# Patient Record
Sex: Female | Born: 1975
Health system: Southern US, Community
[De-identification: ages and names within clinical notes are randomized; demographics above are authoritative.]

## PROBLEM LIST (undated history)

## (undated) ENCOUNTER — Inpatient Hospital Stay (HOSPITAL_COMMUNITY): Payer: Self-pay

## (undated) DIAGNOSIS — N1370 Vesicoureteral-reflux, unspecified: Secondary | ICD-10-CM

## (undated) DIAGNOSIS — J40 Bronchitis, not specified as acute or chronic: Secondary | ICD-10-CM

## (undated) DIAGNOSIS — K219 Gastro-esophageal reflux disease without esophagitis: Secondary | ICD-10-CM

## (undated) DIAGNOSIS — IMO0002 Reserved for concepts with insufficient information to code with codable children: Secondary | ICD-10-CM

## (undated) DIAGNOSIS — I10 Essential (primary) hypertension: Secondary | ICD-10-CM

## (undated) DIAGNOSIS — O09529 Supervision of elderly multigravida, unspecified trimester: Secondary | ICD-10-CM

## (undated) DIAGNOSIS — Z98891 History of uterine scar from previous surgery: Secondary | ICD-10-CM

## (undated) DIAGNOSIS — Z3480 Encounter for supervision of other normal pregnancy, unspecified trimester: Secondary | ICD-10-CM

## (undated) DIAGNOSIS — N189 Chronic kidney disease, unspecified: Secondary | ICD-10-CM

## (undated) DIAGNOSIS — O09299 Supervision of pregnancy with other poor reproductive or obstetric history, unspecified trimester: Secondary | ICD-10-CM

## (undated) DIAGNOSIS — B019 Varicella without complication: Secondary | ICD-10-CM

## (undated) DIAGNOSIS — R87619 Unspecified abnormal cytological findings in specimens from cervix uteri: Secondary | ICD-10-CM

## (undated) DIAGNOSIS — T7840XA Allergy, unspecified, initial encounter: Secondary | ICD-10-CM

## (undated) DIAGNOSIS — Z87448 Personal history of other diseases of urinary system: Secondary | ICD-10-CM

## (undated) DIAGNOSIS — R12 Heartburn: Secondary | ICD-10-CM

## (undated) HISTORY — DX: Supervision of elderly multigravida, unspecified trimester: O09.529

## (undated) HISTORY — DX: Vesicoureteral-reflux, unspecified: N13.70

## (undated) HISTORY — DX: Unspecified abnormal cytological findings in specimens from cervix uteri: R87.619

## (undated) HISTORY — DX: Supervision of pregnancy with other poor reproductive or obstetric history, unspecified trimester: O09.299

## (undated) HISTORY — DX: Varicella without complication: B01.9

## (undated) HISTORY — DX: Allergy, unspecified, initial encounter: T78.40XA

## (undated) HISTORY — DX: Reserved for concepts with insufficient information to code with codable children: IMO0002

## (undated) HISTORY — DX: Gastro-esophageal reflux disease without esophagitis: K21.9

## (undated) HISTORY — DX: Personal history of other diseases of urinary system: Z87.448

## (undated) HISTORY — PX: KIDNEY SURGERY: SHX687

## (undated) HISTORY — PX: TONSILLECTOMY: SUR1361

---

## 2012-05-02 ENCOUNTER — Inpatient Hospital Stay (HOSPITAL_COMMUNITY)
Admission: AD | Admit: 2012-05-02 | Discharge: 2012-05-02 | Disposition: A | Discharge status: Home / Self Care | Payer: BC Managed Care – PPO | Source: Ambulatory Visit | Attending: Obstetrics & Gynecology | Admitting: Obstetrics & Gynecology

## 2012-05-02 ENCOUNTER — Inpatient Hospital Stay (HOSPITAL_COMMUNITY): Payer: BC Managed Care – PPO

## 2012-05-02 ENCOUNTER — Encounter (HOSPITAL_COMMUNITY): Payer: Self-pay | Admitting: *Deleted

## 2012-05-02 DIAGNOSIS — O209 Hemorrhage in early pregnancy, unspecified: Secondary | ICD-10-CM | POA: Insufficient documentation

## 2012-05-02 HISTORY — DX: Bronchitis, not specified as acute or chronic: J40

## 2012-05-02 HISTORY — DX: Chronic kidney disease, unspecified: N18.9

## 2012-05-02 HISTORY — DX: Essential (primary) hypertension: I10

## 2012-05-02 LAB — POCT PREGNANCY, URINE: Preg Test, Ur: POSITIVE — AB

## 2012-05-02 LAB — CBC WITH DIFFERENTIAL/PLATELET
Hemoglobin: 12.7 g/dL (ref 12.0–15.0)
Lymphocytes Relative: 33 % (ref 12–46)
Lymphs Abs: 2.9 10*3/uL (ref 0.7–4.0)
MCV: 89.2 fL (ref 78.0–100.0)
Neutrophils Relative %: 54 % (ref 43–77)
Platelets: 283 10*3/uL (ref 150–400)
RBC: 4.16 MIL/uL (ref 3.87–5.11)
WBC: 8.8 10*3/uL (ref 4.0–10.5)

## 2012-05-02 LAB — URINALYSIS, ROUTINE W REFLEX MICROSCOPIC
Nitrite: NEGATIVE
Protein, ur: NEGATIVE mg/dL
Urobilinogen, UA: 0.2 mg/dL (ref 0.0–1.0)

## 2012-05-02 LAB — HCG, QUANTITATIVE, PREGNANCY: hCG, Beta Chain, Quant, S: 2090 m[IU]/mL — ABNORMAL HIGH (ref <5)

## 2012-05-02 LAB — URINE MICROSCOPIC-ADD ON

## 2012-05-02 LAB — WET PREP, GENITAL
Clue Cells Wet Prep HPF POC: NONE SEEN
Trich, Wet Prep: NONE SEEN

## 2012-05-02 LAB — ABO/RH: ABO/RH(D): A POS

## 2012-05-02 NOTE — MAU Note (Signed)
+   HPT and at [facility]. Has been bleeding off and on. "Has never gushed."

## 2012-05-02 NOTE — MAU Provider Note (Signed)
History     CSN: [id number]  Arrival date and time: 05/02/12 1640   First Provider Initiated Contact with Patient 05/02/12 1904      Chief Complaint  Patient presents with  . Vaginal Bleeding   HPI Beth Glover is a 36 y.o. female @ [redacted]w[redacted]d gestation who presents to MAU with vaginal bleeding. The bleeding started 9/23 and thought was a period but only lasted 2 days. Then started again 4 days ago. She describes the bleeding as small amount. Associated symptoms include cramping that is mild. The history was provided by the patient.  OB History    Grav Para Term Preterm Abortions TAB SAB Ect Mult Living   4 2 2  1 1    2       Past Medical History  Diagnosis Date  . Hypertension   . Chronic kidney disease     R ureteral reflux  . Bronchitis     Past Surgical History  Procedure Date  . Tonsillectomy   . Kidney surgery     Correct R ureteral reflux, 2002    Family History  Problem Relation Age of Onset  . Cancer Mother   . Cancer Father   . Diabetes Sister     History  Substance Use Topics  . Smoking status: Current Every Day Smoker -- 1.0 packs/day  . Smokeless tobacco: Never Used  . Alcohol Use: Yes     every other night    Allergies:  Allergies  Allergen Reactions  . Amoxicillin Hives, Shortness Of Breath and Swelling    Can't take any cilllins  . Penicillins Hives, Shortness Of Breath and Swelling    Prescriptions prior to admission  Medication Sig Dispense Refill  . acetaminophen (TYLENOL) 500 MG tablet Take 1,000 mg by mouth every 6 (six) hours as needed. For pain or headache      . albuterol (PROVENTIL HFA;VENTOLIN HFA) 108 (90 BASE) MCG/ACT inhaler Inhale 2 puffs into the lungs every 6 (six) hours as needed. For bronchitis      . ibuprofen (ADVIL,MOTRIN) 200 MG tablet Take 400-1,000 mg by mouth every 6 (six) hours as needed. For pain or headache        Review of Systems  Constitutional: Negative for fever, chills and weight loss.  HENT:  Positive for congestion. Negative for ear pain, nosebleeds, sore throat and neck pain.   Eyes: Negative for blurred vision, double vision, photophobia and pain.  Respiratory: Positive for cough. Negative for shortness of breath and wheezing.   Cardiovascular: Negative for chest pain, palpitations and leg swelling.  Gastrointestinal: Positive for heartburn, nausea and abdominal pain (cramping). Negative for vomiting, diarrhea and constipation.  Genitourinary: Positive for frequency. Negative for dysuria and urgency.  Musculoskeletal: Positive for back pain (chronic). Negative for myalgias.  Skin: Negative for itching and rash.  Neurological: Positive for dizziness. Negative for sensory change, speech change, seizures, weakness and headaches.  Endo/Heme/Allergies: Does not bruise/bleed easily.  Psychiatric/Behavioral: Negative for depression. The patient is nervous/anxious. The patient does not have insomnia.    Physical Exam   Blood pressure 120/80, pulse 78, temperature 98.7 F (37.1 C), temperature source Oral, resp. rate 18, height 5' 2.5" (1.588 m), weight 110 lb (49.896 kg), last menstrual period 03/29/2012, SpO2 99.00%.  Physical Exam  Nursing note and vitals reviewed. Constitutional: She is oriented to person, place, and time. She appears well-developed and well-nourished. No distress.  HENT:  Head: Normocephalic and atraumatic.  Eyes: EOM are normal.  Neck: Neck supple.  Cardiovascular: Normal rate.   Respiratory: Effort normal.  GI: Soft. There is no tenderness.  Genitourinary:       External genitalia without lesions. Small blood vaginal vault. Cervix long, closed, no CMT, no adnexal tenderness or mass palpated. Uterus without palpable enlargement.  Musculoskeletal: Normal range of motion.  Neurological: She is alert and oriented to person, place, and time.  Skin: Skin is warm and dry.  Psychiatric: She has a normal mood and affect. Her behavior is normal. Judgment and  thought content normal.   Results for orders placed during the hospital encounter of 05/02/12 (from the past 24 hour(s))  URINALYSIS, ROUTINE W REFLEX MICROSCOPIC     Status: Abnormal   Collection Time   05/02/12  4:55 PM      Component Value Range   Color, Urine YELLOW  YELLOW   APPearance CLEAR  CLEAR   Specific Gravity, Urine 1.020  1.005 - 1.030   pH 7.0  5.0 - 8.0   Glucose, UA NEGATIVE  NEGATIVE mg/dL   Hgb urine dipstick LARGE (*) NEGATIVE   Bilirubin Urine NEGATIVE  NEGATIVE   Ketones, ur NEGATIVE  NEGATIVE mg/dL   Protein, ur NEGATIVE  NEGATIVE mg/dL   Urobilinogen, UA 0.2  0.0 - 1.0 mg/dL   Nitrite NEGATIVE  NEGATIVE   Leukocytes, UA NEGATIVE  NEGATIVE  URINE MICROSCOPIC-ADD ON     Status: Abnormal   Collection Time   05/02/12  4:55 PM      Component Value Range   Squamous Epithelial / LPF FEW (*) RARE   WBC, UA 3-6  <3 WBC/hpf   RBC / HPF 0-2  <3 RBC/hpf   Bacteria, UA MANY (*) RARE   Urine-Other MUCOUS PRESENT    POCT PREGNANCY, URINE     Status: Abnormal   Collection Time   05/02/12  5:25 PM      Component Value Range   Preg Test, Ur POSITIVE (*) NEGATIVE  CBC WITH DIFFERENTIAL     Status: Normal   Collection Time   05/02/12  5:57 PM      Component Value Range   WBC 8.8  4.0 - 10.5 K/uL   RBC 4.16  3.87 - 5.11 MIL/uL   Hemoglobin 12.7  12.0 - 15.0 g/dL   HCT 69.6  29.5 - 28.4 %   MCV 89.2  78.0 - 100.0 fL   MCH 30.5  26.0 - 34.0 pg   MCHC 34.2  30.0 - 36.0 g/dL   RDW 13.2  44.0 - 10.2 %   Platelets 283  150 - 400 K/uL   Neutrophils Relative 54  43 - 77 %   Neutro Abs 4.8  1.7 - 7.7 K/uL   Lymphocytes Relative 33  12 - 46 %   Lymphs Abs 2.9  0.7 - 4.0 K/uL   Monocytes Relative 7  3 - 12 %   Monocytes Absolute 0.6  0.1 - 1.0 K/uL   Eosinophils Relative 5  0 - 5 %   Eosinophils Absolute 0.5  0.0 - 0.7 K/uL   Basophils Relative 1  0 - 1 %   Basophils Absolute 0.0  0.0 - 0.1 K/uL  HCG, QUANTITATIVE, PREGNANCY     Status: Abnormal   Collection Time    05/02/12  5:57 PM      Component Value Range   hCG, Beta Chain, Quant, S 2090 (*) <5 mIU/mL  ABO/RH     Status: Normal   Collection Time   05/02/12  5:57  PM      Component Value Range   ABO/RH(D) A POS    WET PREP, GENITAL     Status: Abnormal   Collection Time   05/02/12  7:06 PM      Component Value Range   Yeast Wet Prep HPF POC NONE SEEN  NONE SEEN   Trich, Wet Prep NONE SEEN  NONE SEEN   Clue Cells Wet Prep HPF POC NONE SEEN  NONE SEEN   WBC, Wet Prep HPF POC MODERATE (*) NONE SEEN   US Ob Comp Less 14 Wks  05/02/2012  *RADIOLOGY REPORT*  Clinical Data: Positive pregnancy test.  OBSTETRIC <14 WK [place] AND TRANSVAGINAL OB US  Technique:  Both transabdominal and transvaginal ultrasound examinations were performed for complete evaluation of the gestation as well as the maternal uterus, adnexal regions, and pelvic cul-de-sac.  Transvaginal technique was performed to assess early pregnancy.  Comparison:  None.  Intrauterine gestational sac:  Tiny sac like structure is identified in the endometrial cavity. Yolk sac: Not visualized Embryo: Not visualized Cardiac Activity: N/A  MSD: 3.2 mm  4 w 6 d  Maternal uterus/adnexae: No evidence for subchorionic hemorrhage.  Maternal right ovary is sonographically normal in appearance.  The maternal left ovary is also normal.  Hypoechoic structure in the left ovarian parenchyma is compatible with a hemorrhagic corpus luteum cyst.  No free fluid in the cul-de-sac.  IMPRESSION:  Probable early intrauterine gestational sac, but no yolk sac, fetal pole, or cardiac activity yet visualized.  Recommend follow- up quantitative B-HCG levels and follow-up US in 14 days to confirm and assess viability.   .   Original Report Authenticated By: ERIC A. MANSELL, M.D.    US Ob Transvaginal  05/02/2012  *RADIOLOGY REPORT*  Clinical Data: Positive pregnancy test.  OBSTETRIC <14 WK [place] AND TRANSVAGINAL OB US  Technique:  Both transabdominal and transvaginal ultrasound examinations  were performed for complete evaluation of the gestation as well as the maternal uterus, adnexal regions, and pelvic cul-de-sac.  Transvaginal technique was performed to assess early pregnancy.  Comparison:  None.  Intrauterine gestational sac:  Tiny sac like structure is identified in the endometrial cavity. Yolk sac: Not visualized Embryo: Not visualized Cardiac Activity: N/A  MSD: 3.2 mm  4 w 6 d  Maternal uterus/adnexae: No evidence for subchorionic hemorrhage.  Maternal right ovary is sonographically normal in appearance.  The maternal left ovary is also normal.  Hypoechoic structure in the left ovarian parenchyma is compatible with a hemorrhagic corpus luteum cyst.  No free fluid in the cul-de-sac.  IMPRESSION:  Probable early intrauterine gestational sac, but no yolk sac, fetal pole, or cardiac activity yet visualized.  Recommend follow- up quantitative B-HCG levels and follow-up US in 14 days to confirm and assess viability.   .   Original Report Authenticated By: ERIC A. MANSELL, M.D.    Assessment: 36 y.o. female with bleeding in early pregnancy  Plan:  Return in 48 hours for bhcg   Return sooner for problems.   Pelvic rest  I have reviewed this patient's vital signs, nurses notes, appropriate labs and imaging. I have discussed the results with the patient and need for follow up. Patient voices understanding.   Medication List     As of 05/02/2012  8:01 PM    CONTINUE taking these medications         acetaminophen 500 MG tablet   Commonly known as: TYLENOL      albuterol 108 (90 BASE)  MCG/ACT inhaler   Commonly known as: PROVENTIL HFA;VENTOLIN HFA      STOP taking these medications         ibuprofen 200 MG tablet   Commonly known as: ADVIL,MOTRIN         MAU Course  Procedures Tiffanee Mcnee, RN, FNP, BC 05/02/2012, 7:41 PM

## 2012-05-06 ENCOUNTER — Inpatient Hospital Stay (HOSPITAL_COMMUNITY)
Admission: AD | Admit: 2012-05-06 | Discharge: 2012-05-06 | Disposition: A | Discharge status: Home / Self Care | Payer: BC Managed Care – PPO | Source: Ambulatory Visit | Attending: Obstetrics & Gynecology | Admitting: Obstetrics & Gynecology

## 2012-05-06 DIAGNOSIS — O209 Hemorrhage in early pregnancy, unspecified: Secondary | ICD-10-CM | POA: Insufficient documentation

## 2012-05-06 DIAGNOSIS — R109 Unspecified abdominal pain: Secondary | ICD-10-CM

## 2012-05-06 DIAGNOSIS — O26899 Other specified pregnancy related conditions, unspecified trimester: Secondary | ICD-10-CM

## 2012-05-06 NOTE — MAU Note (Signed)
Pt reports she had some bright red bleeding yesterday and clumps come out. No bleeding today and mild cramping light discharge

## 2012-05-06 NOTE — MAU Provider Note (Signed)
  History     CSN: [id number]  Arrival date and time: 05/06/12 0919   None     No chief complaint on file.  HPI 36 y.o. [id number] here for f/u quant. HCG was 2000 4 days ago, had pain and some bleeding. Yesterday passed some small clots, bleeding decreased today.    Past Medical History  Diagnosis Date  . Hypertension   . Chronic kidney disease     R ureteral reflux  . Bronchitis     Past Surgical History  Procedure Date  . Tonsillectomy   . Kidney surgery     Correct R ureteral reflux, 2002    Family History  Problem Relation Age of Onset  . Cancer Mother   . Cancer Father   . Diabetes Sister     History  Substance Use Topics  . Smoking status: Current Every Day Smoker -- 1.0 packs/day  . Smokeless tobacco: Never Used  . Alcohol Use: Yes     every other night    Allergies:  Allergies  Allergen Reactions  . Amoxicillin Hives, Shortness Of Breath and Swelling    Can't take any cilllins  . Penicillins Hives, Shortness Of Breath and Swelling    Prescriptions prior to admission  Medication Sig Dispense Refill  . acetaminophen (TYLENOL) 500 MG tablet Take 1,000 mg by mouth every 6 (six) hours as needed. For pain or headache      . albuterol (PROVENTIL HFA;VENTOLIN HFA) 108 (90 BASE) MCG/ACT inhaler Inhale 2 puffs into the lungs every 6 (six) hours as needed. For bronchitis        Review of Systems  Constitutional: Negative.   Respiratory: Negative.   Cardiovascular: Negative.   Gastrointestinal: Negative.    Physical Exam   Blood pressure 130/82, pulse 75, temperature 98.1 F (36.7 C), temperature source Oral, resp. rate 18, last menstrual period 03/29/2012.  Physical Exam  Nursing note and vitals reviewed.   MAU Course  Procedures Results for orders placed during the hospital encounter of 05/06/12 (from the past 24 hour(s))  HCG, QUANTITATIVE, PREGNANCY     Status: Abnormal   Collection Time   05/06/12  9:41 AM      Component Value Range   hCG,  Beta Chain, Quant, S 8537 (*) <5 mIU/mL     Assessment and Plan  36 y.o. [id number] at [redacted]w[redacted]d with bleeding Follow-up Information    Follow up with [facility] [place] [facility]ERN[facility] ADMISS[facility]. On 05/14/2012.   Contact information:   [address] [id number] mc [place] [address] [phone]      Follow up with [facility] [place] [facility]TRASOUND. On 05/14/2012.   Contact information:   [address] [id number] mc [place] [address] [phone]        Precautions rev'd  Georges Mouse 05/06/2012, 10:44 AM

## 2012-05-06 NOTE — ED Notes (Signed)
N.Fraizier,CNM called pt with results of BHCG. U/s scheduled for 10/14

## 2012-05-14 ENCOUNTER — Ambulatory Visit (HOSPITAL_COMMUNITY)
Admission: RE | Admit: 2012-05-14 | Discharge: 2012-05-14 | Disposition: A | Discharge status: Home / Self Care | Payer: BC Managed Care – PPO | Source: Ambulatory Visit | Attending: Advanced Practice Midwife | Admitting: Advanced Practice Midwife

## 2012-05-14 ENCOUNTER — Ambulatory Visit (HOSPITAL_COMMUNITY): Payer: BC Managed Care – PPO

## 2012-05-14 DIAGNOSIS — Z3689 Encounter for other specified antenatal screening: Secondary | ICD-10-CM | POA: Insufficient documentation

## 2012-05-14 DIAGNOSIS — O3680X0 Pregnancy with inconclusive fetal viability, not applicable or unspecified: Secondary | ICD-10-CM | POA: Insufficient documentation

## 2012-05-14 DIAGNOSIS — O26899 Other specified pregnancy related conditions, unspecified trimester: Secondary | ICD-10-CM

## 2012-06-27 LAB — OB RESULTS CONSOLE HIV ANTIBODY (ROUTINE TESTING): HIV: NONREACTIVE

## 2012-06-27 LAB — OB RESULTS CONSOLE ANTIBODY SCREEN: Antibody Screen: NEGATIVE

## 2012-06-27 LAB — OB RESULTS CONSOLE RUBELLA ANTIBODY, IGM: Rubella: IMMUNE

## 2012-06-27 LAB — OB RESULTS CONSOLE RPR: RPR: NONREACTIVE

## 2012-06-27 LAB — OB RESULTS CONSOLE HEPATITIS B SURFACE ANTIGEN: Hepatitis B Surface Ag: NEGATIVE

## 2012-09-14 ENCOUNTER — Encounter (HOSPITAL_COMMUNITY): Payer: Self-pay | Admitting: *Deleted

## 2012-09-14 ENCOUNTER — Inpatient Hospital Stay (HOSPITAL_COMMUNITY)
Admission: AD | Admit: 2012-09-14 | Discharge: 2012-09-14 | Disposition: A | Discharge status: Home / Self Care | Payer: BC Managed Care – PPO | Source: Ambulatory Visit | Attending: Obstetrics and Gynecology | Admitting: Obstetrics and Gynecology

## 2012-09-14 DIAGNOSIS — O479 False labor, unspecified: Secondary | ICD-10-CM

## 2012-09-14 DIAGNOSIS — O4700 False labor before 37 completed weeks of gestation, unspecified trimester: Secondary | ICD-10-CM | POA: Insufficient documentation

## 2012-09-14 DIAGNOSIS — O4702 False labor before 37 completed weeks of gestation, second trimester: Secondary | ICD-10-CM

## 2012-09-14 HISTORY — DX: Heartburn: R12

## 2012-09-14 LAB — URINALYSIS, ROUTINE W REFLEX MICROSCOPIC
Leukocytes, UA: NEGATIVE
Nitrite: NEGATIVE
Protein, ur: NEGATIVE mg/dL
Urobilinogen, UA: 0.2 mg/dL (ref 0.0–1.0)

## 2012-09-14 LAB — WET PREP, GENITAL
Clue Cells Wet Prep HPF POC: NONE SEEN
Trich, Wet Prep: NONE SEEN
Yeast Wet Prep HPF POC: NONE SEEN

## 2012-09-14 MED ORDER — NIFEDIPINE 10 MG PO CAPS
20.0000 mg | ORAL_CAPSULE | Freq: Three times a day (TID) | ORAL | Status: DC
  Administered 2012-09-14: 20 mg via ORAL
  Filled 2012-09-14: qty 2

## 2012-09-14 MED ORDER — ACETAMINOPHEN 500 MG PO TABS
1000.0000 mg | ORAL_TABLET | Freq: Once | ORAL | Status: AC
  Administered 2012-09-14: 1000 mg via ORAL
  Filled 2012-09-14: qty 2

## 2012-09-14 MED ORDER — LACTATED RINGERS IV BOLUS (SEPSIS)
1000.0000 mL | Freq: Once | INTRAVENOUS | Status: AC
  Administered 2012-09-14: 1000 mL via INTRAVENOUS

## 2012-09-14 MED ORDER — TERBUTALINE SULFATE 1 MG/ML IJ SOLN
0.2500 mg | Freq: Once | INTRAMUSCULAR | Status: AC
  Administered 2012-09-14: 0.25 mg via SUBCUTANEOUS
  Filled 2012-09-14: qty 1

## 2012-09-14 NOTE — MAU Provider Note (Signed)
History     CSN: [id number]  Arrival date and time: 09/14/12 4098   First Provider Initiated Contact with Patient 09/14/12 1027      Chief Complaint  Patient presents with  . Contractions   HPI 37 y.o. [id number] at [redacted]w[redacted]d with contractions since last night, q 5 min, feel somewhat stronger now, no bleeding or LOF. Intercourse last night. Uncomplicated pregnancy, no h/o preterm delivery.   Past Medical History  Diagnosis Date  . Hypertension   . Chronic kidney disease     R ureteral reflux  . Bronchitis   . Heartburn     Past Surgical History  Procedure Laterality Date  . Tonsillectomy    . Kidney surgery      Correct R ureteral reflux, 2002    Family History  Problem Relation Age of Onset  . Cancer Mother   . Cancer Father   . Diabetes Sister     History  Substance Use Topics  . Smoking status: Current Every Day Smoker -- 0.25 packs/day    Types: Cigarettes  . Smokeless tobacco: Never Used  . Alcohol Use: Yes     Comment: Occas. since + UPT    Allergies:  Allergies  Allergen Reactions  . Amoxicillin Hives, Shortness Of Breath and Swelling    Can't take any cilllins  . Penicillins Hives, Shortness Of Breath and Swelling  . Lisinopril Other (See Comments)    Throat itching  . Amlodipine Palpitations    Prescriptions prior to admission  Medication Sig Dispense Refill  . acetaminophen (TYLENOL) 500 MG tablet Take 1,000 mg by mouth every 6 (six) hours as needed. For pain or headache      . ranitidine (ZANTAC) 150 MG tablet Take 150 mg by mouth daily as needed for heartburn.      . Sodium Bicarbonate-Citric Acid (ALKA-SELTZER HEARTBURN PO) Take 1-2 tablets by mouth daily as needed. For heartburn        Review of Systems  Constitutional: Negative.   Respiratory: Negative.   Cardiovascular: Negative.   Gastrointestinal: Negative for nausea, vomiting, abdominal pain, diarrhea and constipation.  Genitourinary: Negative for dysuria, urgency, frequency,  hematuria and flank pain.       Negative for vaginal bleeding, + cramping/contractions  Musculoskeletal: Negative.   Neurological: Negative.   Psychiatric/Behavioral: Negative.    Physical Exam   Blood pressure 134/81, pulse 94, temperature 98.6 F (37 C), temperature source Oral, resp. rate 18, height 5' 2.5" (1.588 m), weight 131 lb (59.421 kg), last menstrual period 03/29/2012.  Physical Exam  Nursing note and vitals reviewed. Constitutional: She is oriented to person, place, and time. She appears well-developed and well-nourished. No distress.  Cardiovascular: Normal rate.   Respiratory: Effort normal.  GI: Soft. There is no tenderness.  Genitourinary: No bleeding around the vagina. No vaginal discharge found.  Dilation: Fingertip ext os/int os closed Effacement (%): Thick Cervical Position: Posterior Station: -3 Exam by:: Telford Nab, CNM   Musculoskeletal: Normal range of motion.  Neurological: She is alert and oriented to person, place, and time.  Skin: Skin is warm and dry.  Psychiatric: She has a normal mood and affect.   EFM reassuring at 24 weeks TOCO: uc q 2-4 min  MAU Course  Procedures Results for orders placed during the hospital encounter of 09/14/12 (from the past 24 hour(s))  URINALYSIS, ROUTINE W REFLEX MICROSCOPIC     Status: Abnormal   Collection Time    09/14/12  9:25 AM  Result Value Range   Color, Urine YELLOW  YELLOW   APPearance CLEAR  CLEAR   Specific Gravity, Urine 1.025  1.005 - 1.030   pH 6.0  5.0 - 8.0   Glucose, UA NEGATIVE  NEGATIVE mg/dL   Hgb urine dipstick NEGATIVE  NEGATIVE   Bilirubin Urine NEGATIVE  NEGATIVE   Ketones, ur 15 (*) NEGATIVE mg/dL   Protein, ur NEGATIVE  NEGATIVE mg/dL   Urobilinogen, UA 0.2  0.0 - 1.0 mg/dL   Nitrite NEGATIVE  NEGATIVE   Leukocytes, UA NEGATIVE  NEGATIVE  WET PREP, GENITAL     Status: Abnormal   Collection Time    09/14/12 10:26 AM      Result Value Range   Yeast Wet Prep HPF POC NONE SEEN   NONE SEEN   Trich, Wet Prep NONE SEEN  NONE SEEN   Clue Cells Wet Prep HPF POC NONE SEEN  NONE SEEN   WBC, Wet Prep HPF POC FEW (*) NONE SEEN   IV hydration for tocolysis, no improvement in contractions Procardia 20 mg po given - no imprevement Terbutaline 0.25 mg [place] - contractions gradually decreased   Assessment and Plan   1. Preterm uterine contractions, second trimester      Medication List    TAKE these medications       acetaminophen 500 MG tablet  Commonly known as:  TYLENOL  Take 1,000 mg by mouth every 6 (six) hours as needed. For pain or headache     ALKA-SELTZER HEARTBURN PO  Take 1-2 tablets by mouth daily as needed. For heartburn     ranitidine 150 MG tablet  Commonly known as:  ZANTAC  Take 150 mg by mouth daily as needed for heartburn.         Follow-up Information   Follow up with BOVARD,JODY, MD. (as scheduled or sooner as needed)    Contact information:   [address] [place] [address] [phone]         Haizlee Henton 09/14/2012, 10:52 AM

## 2012-09-14 NOTE — MAU Note (Signed)
Pt states she started having pressure & uc's @ 0100, continues this a.m.  Denis bleeding or LOF.

## 2012-09-14 NOTE — MAU Note (Signed)
States had intercourse last night.

## 2012-09-15 LAB — GC/CHLAMYDIA PROBE AMP
CT Probe RNA: NEGATIVE
GC Probe RNA: NEGATIVE

## 2012-10-12 ENCOUNTER — Encounter (HOSPITAL_COMMUNITY): Payer: Self-pay | Admitting: *Deleted

## 2012-10-12 ENCOUNTER — Inpatient Hospital Stay (HOSPITAL_COMMUNITY)
Admission: AD | Admit: 2012-10-12 | Discharge: 2012-10-12 | Disposition: A | Discharge status: Home / Self Care | Payer: BC Managed Care – PPO | Source: Ambulatory Visit | Attending: Obstetrics and Gynecology | Admitting: Obstetrics and Gynecology

## 2012-10-12 DIAGNOSIS — O4703 False labor before 37 completed weeks of gestation, third trimester: Secondary | ICD-10-CM

## 2012-10-12 DIAGNOSIS — O4690 Antepartum hemorrhage, unspecified, unspecified trimester: Secondary | ICD-10-CM | POA: Insufficient documentation

## 2012-10-12 DIAGNOSIS — O479 False labor, unspecified: Secondary | ICD-10-CM

## 2012-10-12 DIAGNOSIS — O4700 False labor before 37 completed weeks of gestation, unspecified trimester: Secondary | ICD-10-CM | POA: Insufficient documentation

## 2012-10-12 MED ORDER — NIFEDIPINE 10 MG PO CAPS
10.0000 mg | ORAL_CAPSULE | Freq: Once | ORAL | Status: AC
  Administered 2012-10-12: 10 mg via ORAL
  Filled 2012-10-12: qty 1

## 2012-10-12 NOTE — MAU Provider Note (Signed)
History     CSN: [id number]  Arrival date and time: 10/12/12 0025   None     No chief complaint on file.  HPI Pt is Beth Glover at [redacted]w[redacted]d.  She has been on pelvic rest for preterm contractions.  She was just checked on Tuesday by Dr. Jackelyn Knife and cervix was closed; pt's restriction of pelvic reset was removed.  Pt was having sex about 1 hour ago and started bleeding and having contractions after.  Pt's bleeding has subsided since she arrived in MAU but ctx continue.  Pt has had pressure on her bladder with frequent urination.  Past Medical History  Diagnosis Date  . Hypertension   . Chronic kidney disease     R ureteral reflux  . Bronchitis   . Heartburn     Past Surgical History  Procedure Laterality Date  . Tonsillectomy    . Kidney surgery      Correct R ureteral reflux, 2002    Family History  Problem Relation Age of Onset  . Cancer Mother   . Cancer Father   . Diabetes Sister     History  Substance Use Topics  . Smoking status: Current Every Day Smoker -- 0.25 packs/day    Types: Cigarettes  . Smokeless tobacco: Never Used  . Alcohol Use: Yes     Comment: Occas. since + UPT,  LAST TIME HAD MARIJUANA- SEPT.Marland Kitchen  LAST TIME HAD ALCOHOL- [place] EVE    Allergies:  Allergies  Allergen Reactions  . Amoxicillin Hives, Shortness Of Breath and Swelling    Can't take any cilllins  . Penicillins Hives, Shortness Of Breath and Swelling  . Lisinopril Other (See Comments)    Throat itching  . Amlodipine Palpitations    Prescriptions prior to admission  Medication Sig Dispense Refill  . acetaminophen (TYLENOL) 500 MG tablet Take 1,000 mg by mouth every 6 (six) hours as needed. For pain or headache      . ranitidine (ZANTAC) 150 MG tablet Take 150 mg by mouth daily as needed for heartburn.      . Sodium Bicarbonate-Citric Acid (ALKA-SELTZER HEARTBURN PO) Take 1-2 tablets by mouth daily as needed. For heartburn        Review of Systems  Constitutional: Negative for fever and  chills.  HENT: Negative for hearing loss.   Gastrointestinal: Positive for abdominal pain. Negative for heartburn, nausea and vomiting.  Genitourinary: Positive for frequency. Negative for dysuria and urgency.   Physical Exam   Blood pressure 143/90, pulse 99, temperature 98.4 F (36.9 C), temperature source Oral, resp. rate 20, height 5\' 2"  (1.575 m), weight 147 lb (66.679 kg), last menstrual period 03/29/2012.  Physical Exam  Nursing note and vitals reviewed. Constitutional: She is oriented to person, place, and time. She appears well-developed and well-nourished. No distress.  HENT:  Head: Normocephalic.  Eyes: Pupils are equal, round, and reactive to light.  Neck: Normal range of motion. Neck supple.  Cardiovascular: Normal rate.   Respiratory: Effort normal.  GI: Soft.  Ctx every 2 to 4 min; FHT reassuring for gestational age  Genitourinary:  Small amount of pink watery discharge in vault; cervix closed, NT; uterus gravid NT  Musculoskeletal: Normal range of motion.  Neurological: She is alert and oriented to person, place, and time.  Skin: Skin is warm and dry.  Psychiatric: She has a normal mood and affect.    MAU Course  Procedures Pt having ctx every 2 to 3 minutes-cervix closed- discussed with Dr, Jackelyn Knife- Procardia  10mg  PO given- pt continued to have ctx 25 minutes after initial dose of Procardia- reordered 10mg  PO Pt's ctx spaced out  To 5 min apart and diminished in intensity to mild-level 1/10; pt states the ctx are more like she had when she was discharged home previously;  pt did not want terbutaline - discussed with Dr. Jackelyn Knife Pt advised to call office in morning to discuss with her primary OB (Dr. Ellyn Hack) when she would like to see her next  Assessment and Plan  Threatened preterm labor Call office tomorrow for advice on follow up  Beth Glover 10/12/2012, 1:00 AM

## 2012-10-12 NOTE — MAU Note (Signed)
PT SAYS SHE STARTED HAVING HEAVY BLEEDING-  DURING SEX- SHE THINKS ITS A CUT  FROM A FINGERNAIL.   IN RM  4 - NO BLEEDING.    WAS HERE  FEB  TO STOP UC- TOLD NO SEX. WAS AT DR ON 3-5- WAS TOLD LIFTED PELVIC REST.   DENIES HSV AND MMRSA.

## 2012-10-15 NOTE — Progress Notes (Signed)
FHT from 4-14 reviewed.  Reactive NST, irreg ctx that gradually spaced out and did not change her cervix.

## 2012-12-23 ENCOUNTER — Inpatient Hospital Stay (HOSPITAL_COMMUNITY)
Admission: AD | Admit: 2012-12-23 | Discharge: 2012-12-23 | Disposition: A | Discharge status: Home / Self Care | Payer: BC Managed Care – PPO | Source: Ambulatory Visit | Attending: Obstetrics and Gynecology | Admitting: Obstetrics and Gynecology

## 2012-12-23 DIAGNOSIS — O479 False labor, unspecified: Secondary | ICD-10-CM | POA: Insufficient documentation

## 2012-12-23 DIAGNOSIS — O99891 Other specified diseases and conditions complicating pregnancy: Secondary | ICD-10-CM

## 2012-12-23 DIAGNOSIS — M549 Dorsalgia, unspecified: Secondary | ICD-10-CM | POA: Insufficient documentation

## 2012-12-23 LAB — POCT FERN TEST: POCT Fern Test: NEGATIVE

## 2012-12-23 NOTE — MAU Note (Signed)
Was sitting on couch and felt leaking happened around 9:00 p.m. No leaking at this time, pelvic pressure, no contractions.

## 2012-12-23 NOTE — MAU Provider Note (Signed)
  History     CSN: [id number]  Arrival date and time: 12/23/12 2201   First Provider Initiated Contact with Patient 12/23/12 2232      Chief Complaint  Patient presents with  . Vaginal Discharge   HPI This is a 37 y.o. female at [redacted]w[redacted]d who presents for report of leaking fluid at home. Some back pain Asked to do a rule out rupture check  OB History   Grav Para Term Preterm Abortions TAB SAB Ect Mult Living   4 2 2  1 1    2       Past Medical History  Diagnosis Date  . Hypertension   . Chronic kidney disease     R ureteral reflux  . Bronchitis   . Heartburn     Past Surgical History  Procedure Laterality Date  . Tonsillectomy    . Kidney surgery      Correct R ureteral reflux, 2002    Family History  Problem Relation Age of Onset  . Cancer Mother   . Cancer Father   . Diabetes Sister     History  Substance Use Topics  . Smoking status: Current Every Day Smoker -- 0.25 packs/day    Types: Cigarettes  . Smokeless tobacco: Never Used  . Alcohol Use: Yes     Comment: Occas. since + UPT,  LAST TIME HAD MARIJUANA- SEPT.Marland Kitchen  LAST TIME HAD ALCOHOL- [place] EVE    Allergies:  Allergies  Allergen Reactions  . Amoxicillin Hives, Shortness Of Breath and Swelling    Can't take any cilllins  . Penicillins Hives, Shortness Of Breath and Swelling  . Lisinopril Other (See Comments)    Throat itching  . Amlodipine Palpitations    Prescriptions prior to admission  Medication Sig Dispense Refill  . acetaminophen (TYLENOL) 500 MG tablet Take 1,000 mg by mouth every 6 (six) hours as needed. For pain or headache      . Prenatal Vit-Fe Fumarate-FA (PRENATAL MULTIVITAMIN) TABS Take 1 tablet by mouth daily at 12 noon.      . ranitidine (ZANTAC) 150 MG tablet Take 150 mg by mouth daily as needed for heartburn.        Review of Systems  Constitutional: Negative for fever.  Gastrointestinal: Negative for abdominal pain.  Genitourinary: Negative for dysuria.  Musculoskeletal:  Positive for back pain.   Physical Exam   Blood pressure 143/90, pulse 92, temperature 98.2 F (36.8 C), temperature source Oral, resp. rate 18, height 5' 2.5" (1.588 m), weight 72.757 kg (160 lb 6.4 oz), last menstrual period 03/29/2012.  Physical Exam  Constitutional: She is oriented to person, place, and time. She appears well-developed and well-nourished.  Cardiovascular: Normal rate.   Respiratory: Effort normal.  GI: Soft. There is no tenderness.  Genitourinary: Uterus normal. Vaginal discharge (milky white, no pooling, no ferning) found.  Musculoskeletal: Normal range of motion.  Neurological: She is alert and oriented to person, place, and time.  Skin: Skin is warm and dry.  Psychiatric: She has a normal mood and affect.    MAU Course  Procedures  MDM FHR reactive UCs irregular and  Mild Dilation: 1 Effacement (%): Thick Station: -3 Exam by:: Wynelle Bourgeois CNM   Assessment and Plan  A:  SIUP at [redacted]w[redacted]d       No evidence of ROM  P:  RN will speak with MD  [facility] 12/23/2012, 10:50 PM

## 2012-12-25 ENCOUNTER — Encounter (HOSPITAL_COMMUNITY): Payer: Self-pay | Admitting: *Deleted

## 2012-12-25 ENCOUNTER — Telehealth (HOSPITAL_COMMUNITY): Payer: Self-pay | Admitting: *Deleted

## 2012-12-25 NOTE — Telephone Encounter (Signed)
Preadmission screen  

## 2012-12-31 ENCOUNTER — Encounter (HOSPITAL_COMMUNITY): Payer: Self-pay

## 2012-12-31 DIAGNOSIS — Z3480 Encounter for supervision of other normal pregnancy, unspecified trimester: Secondary | ICD-10-CM

## 2012-12-31 HISTORY — DX: Encounter for supervision of other normal pregnancy, unspecified trimester: Z34.80

## 2012-12-31 MED ORDER — FAMOTIDINE 20 MG PO TABS
20.0000 mg | ORAL_TABLET | Freq: Every day | ORAL | Status: DC
  Administered 2013-01-01: 20 mg via ORAL
  Filled 2012-12-31: qty 1

## 2012-12-31 NOTE — H&P (Signed)
Beth Glover is a 37 y.o. female [id number] at 39+ for iol given term pregnancy and favorable cervix.  Relatively uncomplicated pregnancy - has h/o PIH and pyelo, Also CHTN?, AMA, weaned off adderall, EIF noted in anatomy scan.  D/W pt r/b/a of IOL.. Maternal Medical History:  Contractions: Frequency: irregular.   Perceived severity is moderate.    Fetal activity: Perceived fetal activity is normal.    Prenatal complications: no prenatal complications Prenatal Complications - Diabetes: none.    OB History   Grav Para Term Preterm Abortions TAB SAB Ect Mult Living   4 2 2  1 1    2     G1 TSVD PIH, pyelo; G2 TSVD PIH, pyelo; G3 TAB; G4 present H/O abn pap, nl since No STDs  Past Medical History  Diagnosis Date  . Hypertension   . Chronic kidney disease     R ureteral reflux  . Bronchitis   . Heartburn   . Abnormal Pap smear   . Ureteral reflux     right  . GERD (gastroesophageal reflux disease)   . Hx of pyelonephritis   . H/O pre-eclampsia in prior pregnancy, currently pregnant   . AMA (advanced maternal age) multigravida 35+   . Normal pregnancy, repeat 12/31/2012   Past Surgical History  Procedure Laterality Date  . Tonsillectomy    . Kidney surgery      Correct R ureteral reflux, 2002   Family History: family history includes Cancer in her father and mother and Diabetes in her sister. Social History:  reports that she has been smoking Cigarettes.  She has been smoking about 0.25 packs per day. She has never used smokeless tobacco. She reports that  drinks alcohol. She reports that she uses illicit drugs (Marijuana). Meds: PNV, Zantac All PCN, Amoxicillin,Lisinopril, Amlodipine    Prenatal Transfer Tool  Maternal Diabetes: No Genetic Screening: Normal Maternal Ultrasounds/Referrals: Abnormal:  Findings:   Isolated EIF (echogenic intracardiac focus) Fetal Ultrasounds or other Referrals:  None Maternal Substance Abuse:  Yes:  Type: Smoker, Marijuana Significant  Maternal Medications:  None Significant Maternal Lab Results:  Lab values include: Group B Strep negative Other Comments:  weaned Adderall  Review of Systems  Constitutional: Negative.   HENT: Negative.   Eyes: Negative.   Respiratory: Negative.   Cardiovascular: Negative.   Gastrointestinal: Negative.   Genitourinary: Negative.   Musculoskeletal: Negative.   Skin: Negative.   Neurological: Negative.   Psychiatric/Behavioral: Negative.       Last menstrual period 03/29/2012. Maternal Exam:  Uterine Assessment: Contraction frequency is irregular.   Abdomen: Fundal height is appropriate for gestation.   Estimated fetal weight is 7-8#.   Fetal presentation: vertex  Introitus: Normal vulva. Normal vagina.  Pelvis: adequate for delivery.   Cervix: Cervix evaluated by digital exam.     Physical Exam  Constitutional: She is oriented to person, place, and time. She appears well-developed and well-nourished.  HENT:  Head: Normocephalic and atraumatic.  Cardiovascular: Normal rate and regular rhythm.   Respiratory: Effort normal and breath sounds normal. No respiratory distress.  GI: Soft. Bowel sounds are normal. There is no tenderness.  Musculoskeletal: Normal range of motion.  Neurological: She is alert and oriented to person, place, and time.  Skin: Skin is warm and dry.  Psychiatric: She has a normal mood and affect. Her behavior is normal.    Prenatal labs: ABO, Rh: --/--/A POS (10/02 1757) Antibody:  neg Rubella:  immune RPR:   NR HBsAg:   neg  HIV:   neg GBS:   neg  Hgb 13.9/Pap HR HPV neg/Plt 239K/ GC neg/ Chl neg/First Tri Scr WNL/ AFP WNL/glucola 135  [place] EDC 6/7 [place] nl anat, EIF, ant plac, female  Assessment/Plan: 36yo [id number] at 39+ for IOL  Expect SVD gbbs neg, no prophylaxis Pitocin and AROM for IOL   Beth Glover,Beth Glover 12/31/2012, 8:24 PM

## 2013-01-01 ENCOUNTER — Inpatient Hospital Stay (HOSPITAL_COMMUNITY)
Admission: RE | Admit: 2013-01-01 | Discharge: 2013-01-04 | DRG: 651 | Disposition: A | Discharge status: Home / Self Care | Payer: BC Managed Care – PPO | Source: Ambulatory Visit | Attending: Obstetrics and Gynecology | Admitting: Obstetrics and Gynecology

## 2013-01-01 ENCOUNTER — Encounter (HOSPITAL_COMMUNITY): Payer: Self-pay

## 2013-01-01 VITALS — BP 106/64 | HR 76 | Temp 97.8°F | Resp 18 | Ht 62.5 in | Wt 160.0 lb

## 2013-01-01 DIAGNOSIS — Z302 Encounter for sterilization: Secondary | ICD-10-CM

## 2013-01-01 DIAGNOSIS — Z3480 Encounter for supervision of other normal pregnancy, unspecified trimester: Secondary | ICD-10-CM

## 2013-01-01 DIAGNOSIS — Z98891 History of uterine scar from previous surgery: Secondary | ICD-10-CM

## 2013-01-01 DIAGNOSIS — O1002 Pre-existing essential hypertension complicating childbirth: Principal | ICD-10-CM | POA: Diagnosis present

## 2013-01-01 DIAGNOSIS — O09529 Supervision of elderly multigravida, unspecified trimester: Secondary | ICD-10-CM | POA: Diagnosis present

## 2013-01-01 DIAGNOSIS — Z3483 Encounter for supervision of other normal pregnancy, third trimester: Secondary | ICD-10-CM

## 2013-01-01 HISTORY — DX: Encounter for supervision of other normal pregnancy, unspecified trimester: Z34.80

## 2013-01-01 HISTORY — DX: History of uterine scar from previous surgery: Z98.891

## 2013-01-01 LAB — CBC
HCT: 35 % — ABNORMAL LOW (ref 36.0–46.0)
HCT: 33 % — ABNORMAL LOW (ref 36.0–46.0)
Hemoglobin: 12 g/dL (ref 12.0–15.0)
MCHC: 34.2 g/dL (ref 30.0–36.0)
Platelets: 210 10*3/uL (ref 150–400)
RBC: 4.03 MIL/uL (ref 3.87–5.11)
RDW: 13.3 % (ref 11.5–15.5)
RDW: 13.5 % (ref 11.5–15.5)
WBC: 11.6 10*3/uL — ABNORMAL HIGH (ref 4.0–10.5)

## 2013-01-01 MED ORDER — EPHEDRINE 5 MG/ML INJ: 10.0000 mg | INTRAVENOUS | Status: DC | PRN

## 2013-01-01 MED ORDER — DIPHENHYDRAMINE HCL 50 MG/ML IJ SOLN: 12.5000 mg | INTRAMUSCULAR | Status: DC | PRN

## 2013-01-01 MED ORDER — ONDANSETRON HCL 4 MG/2ML IJ SOLN: 4.0000 mg | Freq: Four times a day (QID) | INTRAMUSCULAR | Status: DC | PRN

## 2013-01-01 MED ORDER — LIDOCAINE HCL (PF) 1 % IJ SOLN
INTRAMUSCULAR | Status: DC | PRN
  Administered 2013-01-01 (×2): 5 mL

## 2013-01-01 MED ORDER — TERBUTALINE SULFATE 1 MG/ML IJ SOLN: 0.2500 mg | Freq: Once | INTRAMUSCULAR | Status: AC | PRN

## 2013-01-01 MED ORDER — PHENYLEPHRINE 40 MCG/ML (10ML) SYRINGE FOR IV PUSH (FOR BLOOD PRESSURE SUPPORT): 80.0000 ug | PREFILLED_SYRINGE | INTRAVENOUS | Status: DC | PRN

## 2013-01-01 MED ORDER — PHENYLEPHRINE 40 MCG/ML (10ML) SYRINGE FOR IV PUSH (FOR BLOOD PRESSURE SUPPORT)
80.0000 ug | PREFILLED_SYRINGE | INTRAVENOUS | Status: DC | PRN
  Filled 2013-01-01: qty 5

## 2013-01-01 MED ORDER — LIDOCAINE HCL (PF) 1 % IJ SOLN: 30.0000 mL | INTRAMUSCULAR | Status: DC | PRN

## 2013-01-01 MED ORDER — FAMOTIDINE 20 MG PO TABS
20.0000 mg | ORAL_TABLET | Freq: Two times a day (BID) | ORAL | Status: DC
  Administered 2013-01-01: 20 mg via ORAL
  Filled 2013-01-01: qty 1

## 2013-01-01 MED ORDER — LACTATED RINGERS IV SOLN
INTRAVENOUS | Status: DC
  Administered 2013-01-01: 23:00:00 via INTRAUTERINE

## 2013-01-01 MED ORDER — OXYTOCIN 40 UNITS IN LACTATED RINGERS INFUSION - SIMPLE MED: 62.5000 mL/h | INTRAVENOUS | Status: DC

## 2013-01-01 MED ORDER — TERBUTALINE SULFATE 1 MG/ML IJ SOLN
INTRAMUSCULAR | Status: AC
  Administered 2013-01-01: 0.25 mg via SUBCUTANEOUS
  Filled 2013-01-01: qty 1

## 2013-01-01 MED ORDER — ACETAMINOPHEN 325 MG PO TABS: 650.0000 mg | ORAL_TABLET | ORAL | Status: DC | PRN

## 2013-01-01 MED ORDER — TERBUTALINE SULFATE 1 MG/ML IJ SOLN: 0.2500 mg | Freq: Once | INTRAMUSCULAR | Status: AC

## 2013-01-01 MED ORDER — LACTATED RINGERS IV SOLN
500.0000 mL | Freq: Once | INTRAVENOUS | Status: AC
  Administered 2013-01-01: 500 mL via INTRAVENOUS

## 2013-01-01 MED ORDER — OXYCODONE-ACETAMINOPHEN 5-325 MG PO TABS: 1.0000 | ORAL_TABLET | ORAL | Status: DC | PRN

## 2013-01-01 MED ORDER — EPHEDRINE 5 MG/ML INJ
10.0000 mg | INTRAVENOUS | Status: DC | PRN
  Filled 2013-01-01: qty 4

## 2013-01-01 MED ORDER — OXYTOCIN 40 UNITS IN LACTATED RINGERS INFUSION - SIMPLE MED
1.0000 m[IU]/min | INTRAVENOUS | Status: DC
  Administered 2013-01-01: 2 m[IU]/min via INTRAVENOUS
  Filled 2013-01-01: qty 1000

## 2013-01-01 MED ORDER — BUTORPHANOL TARTRATE 1 MG/ML IJ SOLN: 2.0000 mg | INTRAMUSCULAR | Status: DC | PRN

## 2013-01-01 MED ORDER — LACTATED RINGERS IV SOLN
500.0000 mL | INTRAVENOUS | Status: DC | PRN
  Administered 2013-01-01 – 2013-01-02 (×2): 500 mL via INTRAVENOUS

## 2013-01-01 MED ORDER — OXYTOCIN BOLUS FROM INFUSION: 500.0000 mL | INTRAVENOUS | Status: DC

## 2013-01-01 MED ORDER — FENTANYL 2.5 MCG/ML BUPIVACAINE 1/10 % EPIDURAL INFUSION (WH - ANES)
14.0000 mL/h | INTRAMUSCULAR | Status: DC | PRN
  Administered 2013-01-01 – 2013-01-02 (×2): 14 mL/h via EPIDURAL
  Filled 2013-01-01 (×2): qty 125

## 2013-01-01 MED ORDER — LACTATED RINGERS IV SOLN
INTRAVENOUS | Status: DC
  Administered 2013-01-01 (×2): via INTRAVENOUS

## 2013-01-01 MED ORDER — CITRIC ACID-SODIUM CITRATE 334-500 MG/5ML PO SOLN
30.0000 mL | ORAL | Status: DC | PRN
  Administered 2013-01-02: 30 mL via ORAL
  Filled 2013-01-01: qty 15

## 2013-01-01 MED ORDER — IBUPROFEN 600 MG PO TABS: 600.0000 mg | ORAL_TABLET | Freq: Four times a day (QID) | ORAL | Status: DC | PRN

## 2013-01-01 NOTE — Progress Notes (Signed)
Resumed care of patient.

## 2013-01-01 NOTE — Progress Notes (Signed)
Patient ID: Beth Glover, female   DOB: [birth year], 37 y.o.   MRN: [id number]  No c/o's.    gen NAD FHTs 140's mod var, early decels toco Q  SVE 2.3/50/-2 AROM for clear fluid  Continue current mgmt; close monitoring

## 2013-01-01 NOTE — Progress Notes (Signed)
Patient ID: Beth Glover, female   DOB: 18-Feb-1976, 37 y.o.   MRN: [id number]  Pt with some ctx. Feeling ctx  AF VSS gen NAD FHTs 140 - 145, mod var toco irr q 4-5  Unable to ROM Will start pitocin and attempt at lunch

## 2013-01-01 NOTE — Anesthesia Procedure Notes (Signed)
Epidural Patient location during procedure: OB Start time: 01/01/2013 6:02 PM  Staffing Anesthesiologist: Angus Seller., Harrell Gave. Performed by: anesthesiologist   Preanesthetic Checklist Completed: patient identified, site marked, surgical consent, pre-op evaluation, timeout performed, IV checked, risks and benefits discussed and monitors and equipment checked  Epidural Patient position: sitting Prep: site prepped and draped and DuraPrep Patient monitoring: continuous pulse ox and blood pressure Approach: midline Injection technique: LOR air and LOR saline  Needle:  Needle type: Tuohy  Needle gauge: 17 G Needle length: 9 cm and 9 Needle insertion depth: 5 cm cm Catheter type: closed end flexible Catheter size: 19 Gauge Catheter at skin depth: 10 cm Test dose: negative  Assessment Events: blood not aspirated, injection not painful, no injection resistance, negative IV test and no paresthesia  Additional Notes Patient identified.  Risk benefits discussed including failed block, incomplete pain control, headache, nerve damage, paralysis, blood pressure changes, nausea, vomiting, reactions to medication both toxic or allergic, and postpartum back pain.  Patient expressed understanding and wished to proceed.  All questions were answered.  Sterile technique used throughout procedure and epidural site dressed with sterile barrier dressing. No paresthesia or other complications noted.The patient did not experience any signs of intravascular injection such as tinnitus or metallic taste in mouth nor signs of intrathecal spread such as rapid motor block. Please see nursing notes for vital signs.

## 2013-01-01 NOTE — Progress Notes (Signed)
Patient ID: Beth Glover, female   DOB: 05-02-1976, 37 y.o.   MRN: [id number]  Comfortable with some ctx  AFVSS  gen NAD FHTs 140's R, good recovery from lates toco Q 5 min  SVE 3/50/-2  d/w pt latent phase of labor. recc epidural to relax

## 2013-01-01 NOTE — Progress Notes (Signed)
Patient ID: Beth Glover, female   DOB: [birth year], 37 y.o.   MRN: [id number]  Pt with repetitive lates.  Given terbutaline.   Recovered to 140's, some accels toco irr, rare  Had been deep repetitive late decels  SVE 3.4/50/-2  Will monitor and restart pitocin after 1/2 - 1 hours D/w pt LTCS inc r/b/a.  Questions answered.   Will do BTL w/ LTCS

## 2013-01-01 NOTE — Progress Notes (Addendum)
Charge RN updated on patient status and notified of need for another RN to take over care of pt. for a while-Charge RN will reassign another RN to take over care.

## 2013-01-01 NOTE — Progress Notes (Signed)
Patient ID: Beth Glover, female   DOB: 18-May-1976, 37 y.o.   MRN: [id number]  Pt comfortable with epidural  AFVSS gen NAD  FHTs 130's  toco irr  IUPC placed  Pitocin restarted slowly D/w pt latent phase of labor, slow unpredictable change.  SVE 3.4/70/-2, more midposition  Continue current management, close monitoring

## 2013-01-01 NOTE — Anesthesia Preprocedure Evaluation (Addendum)
Anesthesia Evaluation  Patient identified by MRN, date of birth, ID band Patient awake    Reviewed: Allergy & Precautions, H&P , Patient's Chart, lab work & pertinent test results  Airway Mallampati: II TM Distance: >3 FB Neck ROM: full    Dental no notable dental hx.    Pulmonary neg pulmonary ROS,  breath sounds clear to auscultation  Pulmonary exam normal       Cardiovascular hypertension, negative cardio ROS  Rhythm:regular Rate:Normal     Neuro/Psych negative neurological ROS  negative psych ROS   GI/Hepatic negative GI ROS, Neg liver ROS, GERD-  ,  Endo/Other  negative endocrine ROS  Renal/GU Renal diseasenegative Renal ROS     Musculoskeletal   Abdominal   Peds  Hematology negative hematology ROS (+)   Anesthesia Other Findings Hypertension     Chronic kidney disease   R ureteral reflux    Bronchitis     Heartburn        Abnormal Pap smear     Ureteral reflux   right    GERD (gastroesophageal reflux disease)     Hx of pyelonephritis        H/O pre-eclampsia in prior pregnancy, currently pregnant     AMA (advanced maternal age) multigravida 35+        Normal pregnancy, repeat 12/31/2012               Reproductive/Obstetrics (+) Pregnancy                           Anesthesia Physical Anesthesia Plan  ASA: III and emergent  Anesthesia Plan: Epidural   Post-op Pain Management:    Induction:   Airway Management Planned:   Additional Equipment:   Intra-op Plan:   Post-operative Plan:   Informed Consent: I have reviewed the patients History and Physical, chart, labs and discussed the procedure including the risks, benefits and alternatives for the proposed anesthesia with the patient or authorized representative who has indicated his/her understanding and acceptance.     Plan Discussed with:   Anesthesia Plan Comments:        Anesthesia Quick Evaluation

## 2013-01-02 ENCOUNTER — Encounter (HOSPITAL_COMMUNITY)
Admission: RE | Disposition: A | Discharge status: Home / Self Care | Payer: Self-pay | Source: Ambulatory Visit | Attending: Obstetrics and Gynecology

## 2013-01-02 ENCOUNTER — Encounter (HOSPITAL_COMMUNITY): Payer: Self-pay | Admitting: Anesthesiology

## 2013-01-02 ENCOUNTER — Inpatient Hospital Stay (HOSPITAL_COMMUNITY): Payer: BC Managed Care – PPO | Admitting: Anesthesiology

## 2013-01-02 ENCOUNTER — Encounter (HOSPITAL_COMMUNITY): Payer: Self-pay

## 2013-01-02 DIAGNOSIS — Z98891 History of uterine scar from previous surgery: Secondary | ICD-10-CM

## 2013-01-02 HISTORY — DX: History of uterine scar from previous surgery: Z98.891

## 2013-01-02 HISTORY — PX: BILATERAL SALPINGECTOMY: SHX5743

## 2013-01-02 LAB — CBC
Hemoglobin: 10.2 g/dL — ABNORMAL LOW (ref 12.0–15.0)
MCH: 29.1 pg (ref 26.0–34.0)
Platelets: 172 10*3/uL (ref 150–400)
RBC: 3.5 MIL/uL — ABNORMAL LOW (ref 3.87–5.11)
WBC: 12.3 10*3/uL — ABNORMAL HIGH (ref 4.0–10.5)

## 2013-01-02 SURGERY — Surgical Case
Anesthesia: Epidural | Site: Abdomen | Wound class: Clean Contaminated

## 2013-01-02 MED ORDER — OXYTOCIN 40 UNITS IN LACTATED RINGERS INFUSION - SIMPLE MED: 62.5000 mL/h | INTRAVENOUS | Status: AC

## 2013-01-02 MED ORDER — MEPERIDINE HCL 25 MG/ML IJ SOLN: 6.2500 mg | INTRAMUSCULAR | Status: DC | PRN

## 2013-01-02 MED ORDER — WITCH HAZEL-GLYCERIN EX PADS: 1.0000 "application " | MEDICATED_PAD | CUTANEOUS | Status: DC | PRN

## 2013-01-02 MED ORDER — 0.9 % SODIUM CHLORIDE (POUR BTL) OPTIME
TOPICAL | Status: DC | PRN
  Administered 2013-01-02: 1000 mL

## 2013-01-02 MED ORDER — SODIUM CHLORIDE 0.9 % IJ SOLN: 3.0000 mL | INTRAMUSCULAR | Status: DC | PRN

## 2013-01-02 MED ORDER — IBUPROFEN 800 MG PO TABS
800.0000 mg | ORAL_TABLET | Freq: Three times a day (TID) | ORAL | Status: DC
  Administered 2013-01-02 – 2013-01-04 (×4): 800 mg via ORAL
  Filled 2013-01-02 (×5): qty 1

## 2013-01-02 MED ORDER — LACTATED RINGERS IV SOLN
INTRAVENOUS | Status: DC
  Administered 2013-01-02 (×2): via INTRAVENOUS

## 2013-01-02 MED ORDER — LACTATED RINGERS IV SOLN
INTRAVENOUS | Status: DC | PRN
  Administered 2013-01-02: 04:00:00 via INTRAVENOUS

## 2013-01-02 MED ORDER — ONDANSETRON HCL 4 MG/2ML IJ SOLN: 4.0000 mg | Freq: Three times a day (TID) | INTRAMUSCULAR | Status: DC | PRN

## 2013-01-02 MED ORDER — NALBUPHINE HCL 10 MG/ML IJ SOLN
5.0000 mg | INTRAMUSCULAR | Status: DC | PRN
  Administered 2013-01-02: 5 mg via INTRAVENOUS
  Filled 2013-01-02 (×2): qty 1

## 2013-01-02 MED ORDER — CLINDAMYCIN PHOSPHATE 900 MG/6ML IJ SOLN
INTRAMUSCULAR | Status: AC
  Administered 2013-01-02: 100 mL via INTRAVENOUS
  Filled 2013-01-02: qty 7.48

## 2013-01-02 MED ORDER — ZOLPIDEM TARTRATE 5 MG PO TABS: 5.0000 mg | ORAL_TABLET | Freq: Every evening | ORAL | Status: DC | PRN

## 2013-01-02 MED ORDER — METOCLOPRAMIDE HCL 5 MG/ML IJ SOLN: 10.0000 mg | Freq: Three times a day (TID) | INTRAMUSCULAR | Status: DC | PRN

## 2013-01-02 MED ORDER — MEPERIDINE HCL 25 MG/ML IJ SOLN
INTRAMUSCULAR | Status: AC
  Filled 2013-01-02: qty 1

## 2013-01-02 MED ORDER — ONDANSETRON HCL 4 MG/2ML IJ SOLN: 4.0000 mg | INTRAMUSCULAR | Status: DC | PRN

## 2013-01-02 MED ORDER — PRENATAL MULTIVITAMIN CH
1.0000 | ORAL_TABLET | Freq: Every day | ORAL | Status: DC
  Administered 2013-01-03: 1 via ORAL
  Filled 2013-01-02: qty 1

## 2013-01-02 MED ORDER — LIDOCAINE-EPINEPHRINE (PF) 2 %-1:200000 IJ SOLN
INTRAMUSCULAR | Status: AC
  Filled 2013-01-02: qty 20

## 2013-01-02 MED ORDER — SODIUM BICARBONATE 8.4 % IV SOLN
INTRAVENOUS | Status: DC | PRN
  Administered 2013-01-02: 10 mL via EPIDURAL

## 2013-01-02 MED ORDER — ONDANSETRON HCL 4 MG PO TABS: 4.0000 mg | ORAL_TABLET | ORAL | Status: DC | PRN

## 2013-01-02 MED ORDER — OXYTOCIN 10 UNIT/ML IJ SOLN
40.0000 [IU] | INTRAVENOUS | Status: DC | PRN
  Administered 2013-01-02: 40 [IU] via INTRAVENOUS

## 2013-01-02 MED ORDER — SIMETHICONE 80 MG PO CHEW: 80.0000 mg | CHEWABLE_TABLET | ORAL | Status: DC | PRN

## 2013-01-02 MED ORDER — FENTANYL CITRATE 0.05 MG/ML IJ SOLN
25.0000 ug | INTRAMUSCULAR | Status: DC | PRN
  Administered 2013-01-02: 25 ug via INTRAVENOUS
  Administered 2013-01-02: 50 ug via INTRAVENOUS
  Administered 2013-01-02: 25 ug via INTRAVENOUS

## 2013-01-02 MED ORDER — FENTANYL CITRATE 0.05 MG/ML IJ SOLN
INTRAMUSCULAR | Status: AC
  Filled 2013-01-02: qty 2

## 2013-01-02 MED ORDER — TETANUS-DIPHTH-ACELL PERTUSSIS 5-2.5-18.5 LF-MCG/0.5 IM SUSP: 0.5000 mL | Freq: Once | INTRAMUSCULAR | Status: DC

## 2013-01-02 MED ORDER — LACTATED RINGERS IV SOLN
INTRAVENOUS | Status: DC
  Administered 2013-01-02 (×2): via INTRAVENOUS

## 2013-01-02 MED ORDER — MENTHOL 3 MG MT LOZG: 1.0000 | LOZENGE | OROMUCOSAL | Status: DC | PRN

## 2013-01-02 MED ORDER — ONDANSETRON HCL 4 MG/2ML IJ SOLN
INTRAMUSCULAR | Status: DC | PRN
  Administered 2013-01-02: 4 mg via INTRAVENOUS

## 2013-01-02 MED ORDER — DIBUCAINE 1 % RE OINT: 1.0000 "application " | TOPICAL_OINTMENT | RECTAL | Status: DC | PRN

## 2013-01-02 MED ORDER — ONDANSETRON HCL 4 MG/2ML IJ SOLN
INTRAMUSCULAR | Status: AC
  Filled 2013-01-02: qty 2

## 2013-01-02 MED ORDER — NALOXONE HCL 1 MG/ML IJ SOLN
1.0000 ug/kg/h | INTRAVENOUS | Status: DC | PRN
  Filled 2013-01-02: qty 2

## 2013-01-02 MED ORDER — DIPHENHYDRAMINE HCL 25 MG PO CAPS: 25.0000 mg | ORAL_CAPSULE | ORAL | Status: DC | PRN

## 2013-01-02 MED ORDER — SIMETHICONE 80 MG PO CHEW
80.0000 mg | CHEWABLE_TABLET | Freq: Three times a day (TID) | ORAL | Status: DC
  Administered 2013-01-02 – 2013-01-03 (×5): 80 mg via ORAL

## 2013-01-02 MED ORDER — LANOLIN HYDROUS EX OINT: 1.0000 "application " | TOPICAL_OINTMENT | CUTANEOUS | Status: DC | PRN

## 2013-01-02 MED ORDER — ACETAMINOPHEN 10 MG/ML IV SOLN
1000.0000 mg | Freq: Four times a day (QID) | INTRAVENOUS | Status: AC | PRN
  Administered 2013-01-02: 1000 mg via INTRAVENOUS
  Filled 2013-01-02 (×3): qty 100

## 2013-01-02 MED ORDER — KETOROLAC TROMETHAMINE 30 MG/ML IJ SOLN
30.0000 mg | Freq: Four times a day (QID) | INTRAMUSCULAR | Status: DC | PRN
  Administered 2013-01-02 (×2): 30 mg via INTRAVENOUS
  Filled 2013-01-02 (×2): qty 1

## 2013-01-02 MED ORDER — NALOXONE HCL 0.4 MG/ML IJ SOLN: 0.4000 mg | INTRAMUSCULAR | Status: DC | PRN

## 2013-01-02 MED ORDER — MEPERIDINE HCL 25 MG/ML IJ SOLN
INTRAMUSCULAR | Status: DC | PRN
  Administered 2013-01-02 (×2): 12.5 mg via INTRAVENOUS

## 2013-01-02 MED ORDER — SCOPOLAMINE 1 MG/3DAYS TD PT72
MEDICATED_PATCH | TRANSDERMAL | Status: AC
  Filled 2013-01-02: qty 1

## 2013-01-02 MED ORDER — SENNOSIDES-DOCUSATE SODIUM 8.6-50 MG PO TABS
2.0000 | ORAL_TABLET | Freq: Every day | ORAL | Status: DC
  Administered 2013-01-02 – 2013-01-03 (×2): 2 via ORAL

## 2013-01-02 MED ORDER — SODIUM BICARBONATE 8.4 % IV SOLN
INTRAVENOUS | Status: AC
  Filled 2013-01-02: qty 50

## 2013-01-02 MED ORDER — SCOPOLAMINE 1 MG/3DAYS TD PT72
1.0000 | MEDICATED_PATCH | Freq: Once | TRANSDERMAL | Status: DC
  Administered 2013-01-02: 1.5 mg via TRANSDERMAL

## 2013-01-02 MED ORDER — NALBUPHINE HCL 10 MG/ML IJ SOLN
5.0000 mg | INTRAMUSCULAR | Status: DC | PRN
  Filled 2013-01-02: qty 1

## 2013-01-02 MED ORDER — DIPHENHYDRAMINE HCL 25 MG PO CAPS: 25.0000 mg | ORAL_CAPSULE | Freq: Four times a day (QID) | ORAL | Status: DC | PRN

## 2013-01-02 MED ORDER — DIPHENHYDRAMINE HCL 50 MG/ML IJ SOLN
12.5000 mg | INTRAMUSCULAR | Status: DC | PRN
  Administered 2013-01-02 (×2): 12.5 mg via INTRAVENOUS
  Filled 2013-01-02 (×2): qty 1

## 2013-01-02 MED ORDER — DIPHENHYDRAMINE HCL 50 MG/ML IJ SOLN: 25.0000 mg | INTRAMUSCULAR | Status: DC | PRN

## 2013-01-02 MED ORDER — MORPHINE SULFATE (PF) 0.5 MG/ML IJ SOLN
INTRAMUSCULAR | Status: DC | PRN
  Administered 2013-01-02: 2 mg via INTRAVENOUS
  Administered 2013-01-02: 3 mg via EPIDURAL

## 2013-01-02 MED ORDER — OXYCODONE-ACETAMINOPHEN 5-325 MG PO TABS
1.0000 | ORAL_TABLET | ORAL | Status: DC | PRN
  Administered 2013-01-02: 1 via ORAL
  Administered 2013-01-03 – 2013-01-04 (×7): 2 via ORAL
  Filled 2013-01-02: qty 2
  Filled 2013-01-02: qty 1
  Filled 2013-01-02 (×4): qty 2
  Filled 2013-01-02 (×2): qty 1
  Filled 2013-01-02: qty 2

## 2013-01-02 MED ORDER — MORPHINE SULFATE 0.5 MG/ML IJ SOLN
INTRAMUSCULAR | Status: AC
  Filled 2013-01-02: qty 10

## 2013-01-02 MED ORDER — OXYTOCIN 10 UNIT/ML IJ SOLN
INTRAMUSCULAR | Status: AC
  Filled 2013-01-02: qty 4

## 2013-01-02 MED ORDER — CALCIUM CARBONATE ANTACID 500 MG PO CHEW: 1.0000 | CHEWABLE_TABLET | Freq: Every day | ORAL | Status: DC | PRN

## 2013-01-02 MED ORDER — FAMOTIDINE 20 MG PO TABS
20.0000 mg | ORAL_TABLET | Freq: Every day | ORAL | Status: DC
  Administered 2013-01-02 – 2013-01-04 (×3): 20 mg via ORAL
  Filled 2013-01-02 (×3): qty 1

## 2013-01-02 SURGICAL SUPPLY — 38 items
BENZOIN TINCTURE PRP APPL 2/3 (GAUZE/BANDAGES/DRESSINGS) ×3 IMPLANT
CLAMP CORD UMBIL (MISCELLANEOUS) ×3 IMPLANT
CLOTH BEACON ORANGE TIMEOUT ST (SAFETY) ×3 IMPLANT
CONTAINER PREFILL 10% NBF 15ML (MISCELLANEOUS) ×6 IMPLANT
DRAPE LG THREE QUARTER DISP (DRAPES) ×3 IMPLANT
DRSG OPSITE POSTOP 4X10 (GAUZE/BANDAGES/DRESSINGS) ×3 IMPLANT
DURAPREP 26ML APPLICATOR (WOUND CARE) ×3 IMPLANT
ELECT REM PT RETURN 9FT ADLT (ELECTROSURGICAL) ×3
ELECTRODE REM PT RTRN 9FT ADLT (ELECTROSURGICAL) ×2 IMPLANT
EXTRACTOR VACUUM M CUP 4 TUBE (SUCTIONS) IMPLANT
GLOVE BIO SURGEON STRL SZ 6.5 (GLOVE) ×3 IMPLANT
GLOVE INDICATOR 7.0 STRL GRN (GLOVE) ×3 IMPLANT
GLOVE SKINSENSE NS SZ7.5 (GLOVE) ×1
GLOVE SKINSENSE NS SZ8.0 LF (GLOVE) ×1
GLOVE SKINSENSE STRL SZ7.5 (GLOVE) ×2 IMPLANT
GLOVE SKINSENSE STRL SZ8.0 LF (GLOVE) ×2 IMPLANT
GOWN STRL REIN XL XLG (GOWN DISPOSABLE) ×6 IMPLANT
KIT ABG SYR 3ML LUER SLIP (SYRINGE) IMPLANT
NEEDLE HYPO 25X5/8 SAFETYGLIDE (NEEDLE) IMPLANT
NS IRRIG 1000ML POUR BTL (IV SOLUTION) ×3 IMPLANT
PACK C SECTION WH (CUSTOM PROCEDURE TRAY) ×3 IMPLANT
PAD OB MATERNITY 4.3X12.25 (PERSONAL CARE ITEMS) ×3 IMPLANT
RTRCTR C-SECT PINK 25CM LRG (MISCELLANEOUS) ×3 IMPLANT
STAPLER VISISTAT 35W (STAPLE) IMPLANT
STRIP CLOSURE SKIN 1/2X4 (GAUZE/BANDAGES/DRESSINGS) ×3 IMPLANT
SUT MNCRL 0 VIOLET CTX 36 (SUTURE) ×4 IMPLANT
SUT MONOCRYL 0 CTX 36 (SUTURE) ×2
SUT PLAIN 1 NONE 54 (SUTURE) IMPLANT
SUT PLAIN 2 0 XLH (SUTURE) ×3 IMPLANT
SUT VIC AB 0 CT1 27 (SUTURE) ×1
SUT VIC AB 0 CT1 27XBRD ANBCTR (SUTURE) ×2 IMPLANT
SUT VIC AB 2-0 CT1 27 (SUTURE) ×1
SUT VIC AB 2-0 CT1 TAPERPNT 27 (SUTURE) ×2 IMPLANT
SUT VIC AB 4-0 KS 27 (SUTURE) ×3 IMPLANT
SYR BULB IRRIGATION 50ML (SYRINGE) IMPLANT
TOWEL OR 17X24 6PK STRL BLUE (TOWEL DISPOSABLE) ×9 IMPLANT
TRAY FOLEY CATH 14FR (SET/KITS/TRAYS/PACK) IMPLANT
WATER STERILE IRR 1000ML POUR (IV SOLUTION) IMPLANT

## 2013-01-02 NOTE — Progress Notes (Signed)
Patient ID: Beth Glover, female   DOB: 02-10-1976, 37 y.o.   MRN: [id number]  Pt comfortable with epidural.  D/W pt essentially unchanged despite adequate contractions for multiple hours.  D/w pt r/b/a of LTCS including bleeding, infection, damage to surrounding organs, injury to baby, and trouble healing.  Pt and family voice understanding.  AFVSS gen NAD FHTs 130's R toco q 4 min  SVE unchanged per RN over multipl eexams  Plan for LTCS.   OR notified.   Will use gent/clinda for prophylaxis given allergies

## 2013-01-02 NOTE — Transfer of Care (Signed)
Immediate Anesthesia Transfer of Care Note  Patient: Beth Glover  Procedure(s) Performed: Procedure(s) with comments: CESAREAN SECTION (N/A) - Primary Cesarean Section Delivery Baby Boy @ 0410, Apgars  BILATERAL SALPINGECTOMY (Bilateral)  Patient Location: PACU  Anesthesia Type:Epidural  Level of Consciousness: awake, alert , oriented and patient cooperative  Airway & Oxygen Therapy: Patient Spontanous Breathing  Post-op Assessment: Report given to PACU RN and Post -op Vital signs reviewed and stable  Post vital signs: Reviewed and stable  Complications: No apparent anesthesia complications

## 2013-01-02 NOTE — Brief Op Note (Signed)
01/01/2013 - 01/02/2013  5:02 AM  PATIENT:  Beth Glover  37 y.o. female  PRE-OPERATIVE DIAGNOSIS:  Arrest of Dilatation, Desires Sterilization  POST-OPERATIVE DIAGNOSIS:  Arrest of Dilatation, Desires Sterilization  PROCEDURE:  Procedure(s) with comments: CESAREAN SECTION (N/A) - Primary Cesarean Section Delivery Baby Boy @ 0410, Apgars  BILATERAL SALPINGECTOMY (Bilateral)  SURGEON:  Surgeon(s) and Role:    * Tijuan Dantes Bovard, MD - Primary  ANESTHESIA:   epidural  EBL:  Total I/O In: 1000 [I.V.:1000] Out: 1550 [Urine:950; Blood:600]  FINDINGS: viable female infant at 4:10am apgars 8/9, wt P; nl uterus, tubes and ovaries   BLOOD ADMINISTERED:none  DRAINS: Urinary Catheter (Foley)   LOCAL MEDICATIONS USED:  NONE  SPECIMEN:  Source of Specimen:  Placenta and B tubes  DISPOSITION OF SPECIMEN:  L&D and pathology  COUNTS:  YES  TOURNIQUET:  * No tourniquets in log *  DICTATION: .Other Dictation: Dictation Number [id number]  PLAN OF CARE: Admit to inpatient   PATIENT DISPOSITION:  PACU - hemodynamically stable.   Delay start of Pharmacological VTE agent (>24hrs) due to surgical blood loss or risk of bleeding: not applicable

## 2013-01-02 NOTE — Anesthesia Postprocedure Evaluation (Signed)
Anesthesia Post Note  Patient: Beth Glover  Procedure(s) Performed: Procedure(s) (LRB): CESAREAN SECTION (N/A) BILATERAL SALPINGECTOMY (Bilateral)  Anesthesia type: Epidural  Patient location: Mother/Baby  Post pain: Pain level controlled  Post assessment: Post-op Vital signs reviewed  Last Vitals:  Filed Vitals:   01/02/13 1555  BP: 103/64  Pulse: 96  Temp: 36.6 C  Resp: 20    Post vital signs: Reviewed  Level of consciousness:alert  Complications: No apparent anesthesia complications

## 2013-01-02 NOTE — Anesthesia Postprocedure Evaluation (Signed)
  Anesthesia Post-op Note  Patient: Beth Glover  Procedure(s) Performed: Procedure(s) with comments: CESAREAN SECTION (N/A) - Primary Cesarean Section Delivery Baby Boy @ 0410, Apgars  BILATERAL SALPINGECTOMY (Bilateral)  Patient Location: PACU  Anesthesia Type:Epidural  Level of Consciousness: awake, alert  and oriented  Airway and Oxygen Therapy: Patient Spontanous Breathing  Post-op Pain: none  Post-op Assessment: Post-op Vital signs reviewed, Patient's Cardiovascular Status Stable, Respiratory Function Stable, Patent Airway, No signs of Nausea or vomiting, Pain level controlled, No headache and No backache  Post-op Vital Signs: Reviewed and stable  Complications: No apparent anesthesia complications

## 2013-01-02 NOTE — Op Note (Signed)
NAME:  Beth Glover, SUBIA NO.:  [id number]  MEDICAL RECORD NO.:  [id number]  LOCATION:  [facility]                          FACILITY:  [facility]  PHYSICIAN:  Sherron Monday, MD        DATE OF BIRTH:  11/05/1975  DATE OF PROCEDURE:  01/02/2013 DATE OF DISCHARGE:                              OPERATIVE REPORT   PREOPERATIVE DIAGNOSIS:  Intrauterine pregnancy at term, arrest of dilatation, desires sterilization.  POSTOPERATIVE DIAGNOSIS:  Intrauterine pregnancy at term, arrest of dilatation, desires sterilization.  PROCEDURE:  Primary low transverse cesarean section with bilateral partial salpingectomy.  SURGEON:  Sherron Monday, MD.  ANESTHESIA:  Epidural.  IV FLUIDS:  1000.  URINE OUTPUT:  50 mL clear urine at the end of procedure.  ESTIMATED BLOOD LOSS:  600 mL.  FINDINGS:  Viable female infant at 4:10 am with Apgars of 8 at 1 minute, 9 at 5 minutes.  Weight pending at the time of dictation.  Normal uterus, tubes, and ovaries were noted.  COMPLICATIONS:  None.  PATHOLOGY:  Placenta to L and D, bilateral tubal segments to pathology.  PROCEDURE:  After informed consent was reviewed with the patient including risks, benefits, and alternatives of surgical procedure, she was transported to the OR in stable condition, where her epidural had been redosed.  She was then prepped and draped in the normal sterile fashion.  A Pfannenstiel skin incision was made.  Prepped and draped in the normal sterile fashion.  Foley catheter had previously been sterilely placed.  A Pfannenstiel skin incision was made at the level of her previous incision from a kidney surgery and carried through to the underlying layer of fascia sharply.  The fascia was incised in the midline.  The incision was extended with Mayo scissors.  The inferior aspect of the fascial incision was grasped with Kocher clamps, elevating the rectus muscles were dissected off both bluntly and sharply. Attention was turned to  the superior portion which in a similar fashion was grasped, 2 Kocher clamps, elevated, and the rectus muscles were dissected off both bluntly and sharply.  Midline was easily identified. Peritoneum was entered bluntly.  Incision was extended superiorly and inferiorly with good visualization of the bladder.  The uterus was explored.  The Alexis skin retractor was carefully placed, making sure no bowel was entrapped.  Vesicouterine peritoneum was identified and uterus was incised in transverse fashion.  Infant was delivered from vertex presentation.  Nose and mouth were suctioned on the field.  Cord was clamped and cut.  The placenta was expressed.  The uterus was cleared of all clot and debris.  The uterine incision was closed with 2 layers of 0 Monocryl, 1st of which was running locked and 2nd as an imbricating layer.  This was noted to be hemostatic.  Attention turned to performing a bilateral salpingectomy initially on the left, tube was easily identified, followed out to the fimbriated end.  Using a Tresa Endo, her tube was doubly ligated and the tubal segment was sent to pathology. It was noted to be hemostatic.  Similarly, it was performed on the right.  Also doubly ligated, also noted be hemostatic.  The tube was also sent to pathology.  The copious pelvic irrigation was performed. Hemostasis was assured.  The peritoneum was reapproximated using 2-0 Vicryl.  The fascia was closed with 0 Vicryl in a running fashion.  The subcuticular adipose layer was made hemostatic with Bovie cautery.  Some scar tissue was released and the incision was closed in a more conducive fashion.  The dead space was closed with 3-0 plain gut.  Skin was closed with 4-0 Vicryl on a Keith needle in subcuticular fashion.  Benzoin and Steri-Strips applied.  The patient tolerated the procedure well. Sponge, lap, and needle counts were correct x2.     Sherron Monday, MD     JB/MEDQ  D:  01/02/2013  T:  01/02/2013   Job:  [id number]

## 2013-01-02 NOTE — Anesthesia Postprocedure Evaluation (Deleted)
  Anesthesia Post-op Note  Patient: Beth Glover  Procedure(s) Performed: Procedure(s) with comments: CESAREAN SECTION (N/A) - Primary Cesarean Section Delivery Baby Boy @ 0410, Apgars  BILATERAL SALPINGECTOMY (Bilateral)  Patient Location: PACU  Anesthesia Type:Epidural  Level of Consciousness: awake, alert , oriented and patient cooperative  Airway and Oxygen Therapy: Patient Spontanous Breathing  Post-op Pain: none  Post-op Assessment: Post-op Vital signs reviewed, Patient's Cardiovascular Status Stable and Respiratory Function Stable  Post-op Vital Signs: Reviewed and stable  Complications: No apparent anesthesia complications

## 2013-01-02 NOTE — Progress Notes (Signed)
Subjective: Postpartum Day 0: Cesarean Delivery Patient reports incisional pain and tolerating PO.    Objective: Vital signs in last 24 hours: Temp:  [97.7 F (36.5 C)-98.8 F (37.1 C)] 98 F (36.7 C) (06/04 0732) Pulse Rate:  [69-123] 69 (06/04 0732) Resp:  [13-21] 16 (06/04 0732) BP: (96-178)/(53-111) 118/72 mmHg (06/04 0732) SpO2:  [94 %-100 %] 98 % (06/04 0732)  Physical Exam:  General: alert and no distress Lochia: appropriate Uterine Fundus: firm Incision: healing well DVT Evaluation: No evidence of DVT seen on physical exam.   Recent Labs  01/01/13 1655 01/02/13 0515  HGB 11.3* 10.2*  HCT 33.0* 30.6*    Assessment/Plan: Status post Cesarean section. Doing well postoperatively.  Continue current care.  Glover,Beth Detter 01/02/2013, 9:02 AM

## 2013-01-03 ENCOUNTER — Encounter (HOSPITAL_COMMUNITY): Payer: Self-pay | Admitting: Obstetrics and Gynecology

## 2013-01-03 LAB — CBC
Hemoglobin: 9.1 g/dL — ABNORMAL LOW (ref 12.0–15.0)
MCHC: 32.9 g/dL (ref 30.0–36.0)
RBC: 3.12 MIL/uL — ABNORMAL LOW (ref 3.87–5.11)

## 2013-01-03 LAB — BIRTH TISSUE RECOVERY COLLECTION (PLACENTA DONATION)

## 2013-01-03 NOTE — Progress Notes (Signed)
Subjective: Postpartum Day 1: Cesarean Delivery Patient reports incisional pain, tolerating PO and no problems voiding.  Nl lochia, pain controlled  Objective: Vital signs in last 24 hours: Temp:  [97.4 F (36.3 C)-98.6 F (37 C)] 98.6 F (37 C) (06/05 0423) Pulse Rate:  [80-117] 101 (06/05 0423) Resp:  [18-20] 18 (06/05 0423) BP: (98-112)/(56-72) 112/68 mmHg (06/05 0423) SpO2:  [93 %-98 %] 97 % (06/05 0423)  Physical Exam:  General: alert and no distress Lochia: appropriate Uterine Fundus: firm Incision: healing well DVT Evaluation: No evidence of DVT seen on physical exam.   Recent Labs  01/02/13 0515 01/03/13 0620  HGB 10.2* 9.1*  HCT 30.6* 27.7*    Assessment/Plan: Status post Cesarean section. Doing well postoperatively.  Continue current care.  Circ today or in AM.    BOVARD,Caileb Rhue 01/03/2013, 8:14 AM

## 2013-01-04 MED ORDER — IBUPROFEN 800 MG PO TABS: 800.0000 mg | ORAL_TABLET | Freq: Three times a day (TID) | ORAL | Status: DC

## 2013-01-04 MED ORDER — PRENATAL MULTIVITAMIN CH: 1.0000 | ORAL_TABLET | Freq: Every day | ORAL | Status: DC

## 2013-01-04 MED ORDER — OXYCODONE-ACETAMINOPHEN 5-325 MG PO TABS: 1.0000 | ORAL_TABLET | Freq: Four times a day (QID) | ORAL | Status: DC | PRN

## 2013-01-04 NOTE — Discharge Summary (Signed)
Obstetric Discharge Summary Reason for Admission: induction of labor Prenatal Procedures: none Intrapartum Procedures: cesarean: low cervical, transverse with BTL Postpartum Procedures: none Complications-Operative and Postpartum: none Hemoglobin  Date Value Range Status  01/03/2013 9.1* 12.0 - 15.0 g/dL Final     HCT  Date Value Range Status  01/03/2013 27.7* 36.0 - 46.0 % Final    Physical Exam:  General: alert and no distress Lochia: appropriate Uterine Fundus: firm Incision: healing well DVT Evaluation: No evidence of DVT seen on physical exam.  Discharge Diagnoses: Term Pregnancy-delivered  Discharge Information: Date: 01/04/2013 Activity: pelvic rest Diet: routine Medications: PNV, Ibuprofen and Percocet Condition: stable Instructions: refer to practice specific booklet Discharge to: home Follow-up Information   Follow up with BOVARD,Jaleigha Deane, MD. Schedule an appointment as soon as possible for a visit in 2 weeks.   Contact information:   [address] [place] [address] [phone]       Newborn Data: Live born female  Birth Weight: 6 lb 1.7 oz (2770 g) APGAR: 8, 9  Home with mother.  BOVARD,Kiyomi Pallo 01/04/2013, 8:54 AM

## 2013-01-04 NOTE — Progress Notes (Signed)
Subjective: Postpartum Day 2: Cesarean Delivery Patient reports incisional pain and tolerating PO.  Nl lochia, pain controlled.    Objective: Vital signs in last 24 hours: Temp:  [97.8 F (36.6 C)-98.4 F (36.9 C)] 97.8 F (36.6 C) (06/06 0527) Pulse Rate:  [76-80] 76 (06/06 0527) Resp:  [18] 18 (06/06 0527) BP: (106-115)/(64-76) 106/64 mmHg (06/06 0527)  Physical Exam:  General: alert and no distress Lochia: appropriate Uterine Fundus: firm Incision: healing well DVT Evaluation: No evidence of DVT seen on physical exam.   Recent Labs  01/02/13 0515 01/03/13 0620  HGB 10.2* 9.1*  HCT 30.6* 27.7*    Assessment/Plan: Status post Cesarean section. Doing well postoperatively.  Continue current care.  Pt desires d/c to home.  Will d/c with motrin/percocet/ pnv.  F/u 2 weeks.    BOVARD,Annalucia Laino 01/04/2013, 8:44 AM

## 2014-02-07 ENCOUNTER — Emergency Department (HOSPITAL_COMMUNITY)
Admission: EM | Admit: 2014-02-07 | Discharge: 2014-02-07 | Disposition: A | Discharge status: Home / Self Care | Payer: BC Managed Care – PPO | Attending: Emergency Medicine | Admitting: Emergency Medicine

## 2014-02-07 ENCOUNTER — Encounter (HOSPITAL_COMMUNITY): Payer: Self-pay | Admitting: Emergency Medicine

## 2014-02-07 DIAGNOSIS — Z79899 Other long term (current) drug therapy: Secondary | ICD-10-CM | POA: Insufficient documentation

## 2014-02-07 DIAGNOSIS — F17200 Nicotine dependence, unspecified, uncomplicated: Secondary | ICD-10-CM | POA: Insufficient documentation

## 2014-02-07 DIAGNOSIS — Z880 Allergy status to penicillin: Secondary | ICD-10-CM | POA: Insufficient documentation

## 2014-02-07 DIAGNOSIS — J159 Unspecified bacterial pneumonia: Secondary | ICD-10-CM | POA: Insufficient documentation

## 2014-02-07 DIAGNOSIS — I129 Hypertensive chronic kidney disease with stage 1 through stage 4 chronic kidney disease, or unspecified chronic kidney disease: Secondary | ICD-10-CM | POA: Insufficient documentation

## 2014-02-07 DIAGNOSIS — Z792 Long term (current) use of antibiotics: Secondary | ICD-10-CM | POA: Insufficient documentation

## 2014-02-07 DIAGNOSIS — Z8719 Personal history of other diseases of the digestive system: Secondary | ICD-10-CM | POA: Insufficient documentation

## 2014-02-07 DIAGNOSIS — N189 Chronic kidney disease, unspecified: Secondary | ICD-10-CM | POA: Insufficient documentation

## 2014-02-07 DIAGNOSIS — J189 Pneumonia, unspecified organism: Secondary | ICD-10-CM

## 2014-02-07 NOTE — ED Provider Notes (Signed)
CSN: [id number]     Arrival date & time 02/07/14  1550 History   First MD Initiated Contact with Patient 02/07/14 1626     This chart was scribed for non-physician practitioner, Santiago GladHeather Yarlin Breisch PA-C, working with Doug SouSam Jacubowitz, MD by Arlan OrganAshley Leger, ED Scribe. This patient was seen in room WTR7/WTR7 and the patient's care was started at 4:55 PM.   Chief Complaint  Patient presents with  . Pneumonia   HPI  HPI Comments: Beth Glover is a 38 y.o. Female with a PMHx of HTN, chronic kidney disease, and GERD who presents to the Emergency Department for a PNA re-check today. Pt states she was recently diagnosed with PNA 2 days ago at Urgent Care and was treated with a course of Zithromax. She reports that she has completed her second dose, but is concerned that symptoms have not improved.  She reports ongoing cough which has been somewhat responsive to prescribed Tussionex. Pt states this cough is exacerbated with deep breathing.  No injury to chest. She admits to a fever 1 day that has now resolved.  At this time she denies any other associated symptoms. No history of blood clots. She has no other pertinent past medical history. She does smoke.  No other concerns this visit.   Past Medical History  Diagnosis Date  . Hypertension   . Chronic kidney disease     R ureteral reflux  . Bronchitis   . Heartburn   . Abnormal Pap smear   . Ureteral reflux     right  . GERD (gastroesophageal reflux disease)   . Hx of pyelonephritis   . H/O pre-eclampsia in prior pregnancy, currently pregnant   . AMA (advanced maternal age) multigravida 35+   . Normal pregnancy, repeat 12/31/2012  . S/P cesarean section 01/02/2013   Past Surgical History  Procedure Laterality Date  . Tonsillectomy    . Kidney surgery      Correct R ureteral reflux, 2002  . Cesarean section N/A 01/02/2013    Procedure: CESAREAN SECTION;  Surgeon: Sherron MondayJody Bovard, MD;  Location: [facility];  Service: Obstetrics;  Laterality: N/A;  Primary  Cesarean Section Delivery Baby Boy @ 0410, Apgars   . Bilateral salpingectomy Bilateral 01/02/2013    Procedure: BILATERAL SALPINGECTOMY;  Surgeon: Sherron MondayJody Bovard, MD;  Location: [facility];  Service: Obstetrics;  Laterality: Bilateral;   Family History  Problem Relation Age of Onset  . Cancer Mother   . Cancer Father   . Diabetes Sister    History  Substance Use Topics  . Smoking status: Current Some Day Smoker -- 0.25 packs/day    Types: Cigarettes  . Smokeless tobacco: Never Used  . Alcohol Use: Yes     Comment: Occas. since + UPT,  LAST TIME HAD MARIJUANA- SEPT.Marland Kitchen.  LAST TIME HAD ALCOHOL- [place] EVE   OB History   Grav Para Term Preterm Abortions TAB SAB Ect Mult Living   4 3 3  1 1    3      Review of Systems  Constitutional: Positive for fever. Negative for chills.  Respiratory: Positive for cough.   Skin: Negative for rash.      Allergies  Amoxicillin; Penicillins; Lisinopril; and Amlodipine  Home Medications   Prior to Admission medications   Medication Sig Start Date End Date Taking? Authorizing Provider  azithromycin (ZITHROMAX) 250 MG tablet Take 250-500 mg by mouth daily. 500 mg on day 1 and 250 mg on days 2-5   Yes Historical Provider, MD  cetirizine (ZYRTEC) 10 MG tablet Take 10 mg by mouth daily.   Yes Historical Provider, MD  chlorpheniramine-HYDROcodone (TUSSIONEX) 10-8 MG/5ML LQCR Take 5 mLs by mouth every 12 (twelve) hours as needed for cough.   Yes Historical Provider, MD   Triage Vitals: BP 137/98  Pulse 98  Temp(Src) 98.9 F (37.2 C) (Oral)  Resp 18  SpO2 98%  LMP 01/13/2014   Physical Exam  Nursing note and vitals reviewed. Constitutional: She is oriented to person, place, and time. She appears well-developed and well-nourished. No distress.  HENT:  Head: Normocephalic.  Mouth/Throat: Oropharynx is clear and moist.  Eyes: EOM are normal.  Neck: Normal range of motion.  Cardiovascular: Normal rate, regular rhythm and normal heart sounds.    Pulmonary/Chest: Effort normal and breath sounds normal. No respiratory distress. She has no wheezes. She has no rales.  Abdominal: She exhibits no distension.  Musculoskeletal: Normal range of motion.  Neurological: She is alert and oriented to person, place, and time.  Skin: Skin is warm and dry. She is not diaphoretic.  Psychiatric: She has a normal mood and affect.    ED Course  Procedures (including critical care time)  DIAGNOSTIC STUDIES: Oxygen Saturation is 98% on RA, Normal by my interpretation.    COORDINATION OF CARE: 5:00 PM-Discussed treatment plan with pt at bedside and pt agreed to plan.     Labs Review Labs Reviewed - No data to display  Imaging Review No results found.   EKG Interpretation None      MDM   Final diagnoses:  None   Patient reports that she was diagnosed with CAP two days ago.  She has taken two doses of Azithromycin, but states that she is not feeling any better.  Pulse ox 98 on RA.  No signs of respiratory distress.  Feel that the patient is stable for discharge.  Patient instructed to continue taking Azithromycin and to continue taking Tussionex as needed for cough.  Return precautions given.    I personally performed the services described in this documentation, which was scribed in my presence. The recorded information has been reviewed and is accurate.    Santiago Glad, PA-C 02/07/14 [phone]

## 2014-02-07 NOTE — Discharge Instructions (Signed)
Continue using Tusionex as needed for cough. Continue taking antibiotic.

## 2014-02-07 NOTE — ED Notes (Signed)
Pt states she went to urgent care and diagnosed with PNA on Wed, Started antibiotics and still not feeling better today.

## 2014-02-08 NOTE — ED Provider Notes (Signed)
Medical screening examination/treatment/procedure(s) were performed by non-physician practitioner and as supervising physician I was immediately available for consultation/collaboration.   EKG Interpretation None       Doug SouSam Avery Klingbeil, MD 02/08/14 [phone]

## 2014-06-02 ENCOUNTER — Encounter (HOSPITAL_COMMUNITY): Payer: Self-pay | Admitting: Emergency Medicine

## 2017-12-11 ENCOUNTER — Telehealth: Payer: Self-pay

## 2017-12-11 NOTE — Telephone Encounter (Signed)
Copied from CRM [id number]. Topic: General - Other >> Dec 11, 2017  1:22 PM Alexander Bergeron B wrote: Reason for CRM: pt called to see if Dr. Zola Button would take her on as a new pt, Fredrik Cove current pt referrred her please let pt know of choice

## 2017-12-11 NOTE — Telephone Encounter (Signed)
Please advise 

## 2017-12-12 NOTE — Telephone Encounter (Signed)
ok 

## 2017-12-12 NOTE — Telephone Encounter (Signed)
Please call Pt and schedule NP appt at Pt's convenience. Thank you.

## 2017-12-12 NOTE — Telephone Encounter (Signed)
Called pt and LVM informing them of Dr. Ernst Spell decision. Advised to call back and schedule a new pt appt.

## 2017-12-12 NOTE — Telephone Encounter (Signed)
Relation to pt: self  Call back number: [phone]   Reason for call:  Patient returned call and scheduled with Dr. Zola Button for 12/18/2017 at 3pm NPA

## 2017-12-18 ENCOUNTER — Encounter: Payer: Self-pay | Admitting: Family Medicine

## 2017-12-18 ENCOUNTER — Ambulatory Visit (INDEPENDENT_AMBULATORY_CARE_PROVIDER_SITE_OTHER): Payer: BLUE CROSS/BLUE SHIELD | Admitting: Family Medicine

## 2017-12-18 VITALS — BP 132/82 | HR 80 | Temp 98.6°F | Resp 16 | Ht 62.0 in | Wt 133.6 lb

## 2017-12-18 DIAGNOSIS — R55 Syncope and collapse: Secondary | ICD-10-CM | POA: Diagnosis not present

## 2017-12-18 DIAGNOSIS — I10 Essential (primary) hypertension: Secondary | ICD-10-CM

## 2017-12-18 MED ORDER — METOPROLOL SUCCINATE ER 25 MG PO TB24: 25.0000 mg | ORAL_TABLET | Freq: Every day | ORAL | 2 refills | Status: DC

## 2017-12-18 NOTE — Assessment & Plan Note (Signed)
Check labs Echo , US carotid to be scheduled

## 2017-12-18 NOTE — Patient Instructions (Signed)
Syncope Syncope is when you temporarily lose consciousness. Syncope may also be called fainting or passing out. It is caused by a sudden decrease in blood flow to the brain. Even though most causes of syncope are not dangerous, syncope can be a sign of a serious medical problem. Signs that you may be about to faint include:  Feeling dizzy or light-headed.  Feeling nauseous.  Seeing all white or all black in your field of vision.  Having cold, clammy skin.  If you fainted, get medical help right away.Call your local emergency services (911 in the [place] Do not drive yourself to the hospital. Follow these instructions at home: Pay attention to any changes in your symptoms. Take these actions to help with your condition:  Have someone stay with you until you feel stable.  Do not drive, use machinery, or play sports until your health care provider says it is okay.  Keep all follow-up visits as told by your health care provider. This is important.  If you start to feel like you might faint, lie down right away and raise (elevate) your feet above the level of your heart. Breathe deeply and steadily. Wait until all of the symptoms have passed.  Drink enough fluid to keep your urine clear or pale yellow.  If you are taking blood pressure or heart medicine, get up slowly and take several minutes to sit and then stand. This can reduce dizziness.  Take over-the-counter and prescription medicines only as told by your health care provider.  Get help right away if:  You have a severe headache.  You have unusual pain in your chest, abdomen, or back.  You are bleeding from your mouth or rectum, or you have black or tarry stool.  You have a very fast or irregular heartbeat (palpitations).  You have pain with breathing.  You faint once or repeatedly.  You have a seizure.  You are confused.  You have trouble walking.  You have severe weakness.  You have vision problems. These  symptoms may represent a serious problem that is an emergency. Do not wait to see if your symptoms will go away. Get medical help right away. Call your local emergency services (911 in the [place] Do not drive yourself to the hospital. This information is not intended to replace advice given to you by your health care provider. Make sure you discuss any questions you have with your health care provider. Document Released: 07/18/2005 Document Revised: 12/24/2015 Document Reviewed: 04/01/2015 Elsevier Interactive Patient Education  2018 Elsevier Inc.  

## 2017-12-18 NOTE — Assessment & Plan Note (Signed)
Well controlled, no changes to meds. Encouraged heart healthy diet such as the DASH diet and exercise as tolerated.  °

## 2017-12-18 NOTE — Progress Notes (Signed)
Patient ID: Beth Glover, female    DOB: 08/07/1975  Age: 42 y.o. MRN: [id number]    Subjective:  Subjective  HPI Beth Glover presents for to be established--- she has had episodes of blacking out  X 2 --- - she has been taking her bp at home ---- and they have been running high--  140-150/90s   No chest pain or palpitations Review of Systems  Constitutional: Negative for chills and fever.  HENT: Negative for congestion and hearing loss.   Eyes: Negative for discharge.  Respiratory: Negative for cough and shortness of breath.   Cardiovascular: Negative for chest pain, palpitations and leg swelling.  Gastrointestinal: Negative for abdominal pain, blood in stool, constipation, diarrhea, nausea and vomiting.  Genitourinary: Negative for dysuria, frequency, hematuria and urgency.  Musculoskeletal: Negative for back pain and myalgias.  Skin: Negative for rash.  Allergic/Immunologic: Negative for environmental allergies.  Neurological: Positive for syncope. Negative for dizziness, weakness and headaches.  Hematological: Does not bruise/bleed easily.  Psychiatric/Behavioral: Negative for suicidal ideas. The patient is not nervous/anxious.     History Past Medical History:  Diagnosis Date  . Abnormal Pap smear   . Allergy   . AMA (advanced maternal age) multigravida 35+   . Bronchitis   . Chicken pox   . Chronic kidney disease    R ureteral reflux  . GERD (gastroesophageal reflux disease)   . H/O pre-eclampsia in prior pregnancy, currently pregnant   . Heartburn   . Hx of pyelonephritis   . Hypertension   . Normal pregnancy, repeat 12/31/2012  . S/P cesarean section 01/02/2013  . Ureteral reflux    right    She has a past surgical history that includes Tonsillectomy; Kidney surgery; Cesarean section (N/A, 01/02/2013); and Bilateral salpingectomy (Bilateral, 01/02/2013).   Her family history includes Asthma in her sister; Cancer in her father, mother, and sister; Depression in  her daughter; Diabetes in her father and sister; Early death in her sister; Heart disease in her maternal grandmother; Hypertension in her sister; Mental retardation in her sister; Miscarriages / Stillbirths in her mother and sister; Stroke in her paternal grandmother.She reports that she has been smoking cigarettes.  She has been smoking about 0.25 packs per day. She has never used smokeless tobacco. She reports that she drinks alcohol. She reports that she has current or past drug history.  Current Outpatient Medications on File Prior to Visit  Medication Sig Dispense Refill  . cetirizine (ZYRTEC) 10 MG tablet Take 10 mg by mouth daily.     No current facility-administered medications on file prior to visit.      Objective:  Objective  Physical Exam  Constitutional: She is oriented to person, place, and time. She appears well-developed and well-nourished.  HENT:  Head: Normocephalic and atraumatic.  Eyes: Conjunctivae and EOM are normal.  Neck: Normal range of motion. Neck supple. No JVD present. Carotid bruit is not present. No thyromegaly present.  Cardiovascular: Normal rate, regular rhythm and normal heart sounds.  No murmur heard. Pulmonary/Chest: Effort normal and breath sounds normal. No respiratory distress. She has no wheezes. She has no rales. She exhibits no tenderness.  Musculoskeletal: She exhibits no edema.  Neurological: She is alert and oriented to person, place, and time.  Psychiatric: She has a normal mood and affect.  Nursing note and vitals reviewed.  BP 132/82 (BP Location: Right Arm, Cuff Size: Normal)   Pulse 80   Temp 98.6 F (37 C) (Oral)   Resp 16  Ht  (1.575 m)   Wt 133 lb 9.6 oz (60.6 kg)   LMP 12/11/2017   SpO2 98%   BMI 24.44 kg/m  Wt Readings from Last 3 Encounters:  12/18/17 133 lb 9.6 oz (60.6 kg)  01/01/13 160 lb (72.6 kg)  12/23/12 160 lb 6.4 oz (72.8 kg)     Lab Results  Component Value Date   WBC 10.4 01/03/2013   HGB 9.1 (L)  01/03/2013   HCT 27.7 (L) 01/03/2013   PLT 166 01/03/2013   [id number]     Assessment & Plan:  Plan  I have discontinued Beth Glover's azithromycin and chlorpheniramine-HYDROcodone. I am also having her start on metoprolol succinate. Additionally, I am having her maintain her cetirizine.  Meds ordered this encounter  Medications  . metoprolol succinate (TOPROL-XL) 25 MG 24 hr tablet    Sig: Take 1 tablet (25 mg total) by mouth daily.    Dispense:  30 tablet    Refill:  2    Problem List Items Addressed This Visit      Unprioritized   Essential hypertension - Primary    Well controlled, no changes to meds. Encouraged heart healthy diet such as the DASH diet and exercise as tolerated.       Relevant Medications   metoprolol succinate (TOPROL-XL) 25 MG 24 hr tablet   Other Relevant Orders   EKG 12-Lead (Completed)   Lipid panel   CBC with Differential/Platelet   Comprehensive metabolic panel   TSH   ECHOCARDIOGRAM COMPLETE   Syncope    Check labs Echo , US carotid to be scheduled      Relevant Medications   metoprolol succinate (TOPROL-XL) 25 MG 24 hr tablet   Other Relevant Orders   Lipid panel   CBC with Differential/Platelet   Comprehensive metabolic panel   TSH   US Carotid Bilateral   ECHOCARDIOGRAM COMPLETE      Follow-up: Return in about 2 weeks (around 01/01/2018) for bp check with her monitor.  Donato Schultz, DO

## 2017-12-19 LAB — COMPREHENSIVE METABOLIC PANEL
ALT: 10 U/L (ref 0–35)
AST: 11 U/L (ref 0–37)
Albumin: 4.6 g/dL (ref 3.5–5.2)
Alkaline Phosphatase: 44 U/L (ref 39–117)
BILIRUBIN TOTAL: 0.4 mg/dL (ref 0.2–1.2)
BUN: 13 mg/dL (ref 6–23)
CALCIUM: 10.2 mg/dL (ref 8.4–10.5)
CHLORIDE: 102 meq/L (ref 96–112)
CO2: 28 meq/L (ref 19–32)
Creatinine, Ser: 0.73 mg/dL (ref 0.40–1.20)
GFR: 92.51 mL/min (ref >60.00)
Glucose, Bld: 102 mg/dL — ABNORMAL HIGH (ref 70–99)
Potassium: 4.1 mEq/L (ref 3.5–5.1)
Sodium: 141 mEq/L (ref 135–145)
Total Protein: 7.7 g/dL (ref 6.0–8.3)

## 2017-12-19 LAB — LIPID PANEL
CHOLESTEROL: 205 mg/dL — AB (ref 0–200)
HDL: 59.1 mg/dL (ref >39.00)
LDL Cholesterol: 128 mg/dL — ABNORMAL HIGH (ref 0–99)
NonHDL: 145.55
TRIGLYCERIDES: 90 mg/dL (ref 0.0–149.0)
Total CHOL/HDL Ratio: 3
VLDL: 18 mg/dL (ref 0.0–40.0)

## 2017-12-19 LAB — CBC WITH DIFFERENTIAL/PLATELET
BASOS PCT: 0.9 % (ref 0.0–3.0)
Basophils Absolute: 0.1 10*3/uL (ref 0.0–0.1)
Eosinophils Absolute: 0.3 10*3/uL (ref 0.0–0.7)
Eosinophils Relative: 4.6 % (ref 0.0–5.0)
HEMATOCRIT: 40.9 % (ref 36.0–46.0)
Hemoglobin: 14 g/dL (ref 12.0–15.0)
LYMPHS PCT: 34.9 % (ref 12.0–46.0)
Lymphs Abs: 2.4 10*3/uL (ref 0.7–4.0)
MCHC: 34.4 g/dL (ref 30.0–36.0)
MCV: 88.8 fl (ref 78.0–100.0)
Monocytes Absolute: 0.4 10*3/uL (ref 0.1–1.0)
Monocytes Relative: 5.9 % (ref 3.0–12.0)
NEUTROS ABS: 3.7 10*3/uL (ref 1.4–7.7)
Neutrophils Relative %: 53.7 % (ref 43.0–77.0)
PLATELETS: 301 10*3/uL (ref 150.0–400.0)
RBC: 4.6 Mil/uL (ref 3.87–5.11)
RDW: 13.9 % (ref 11.5–15.5)
WBC: 6.9 10*3/uL (ref 4.0–10.5)

## 2017-12-19 LAB — TSH: TSH: 1.31 u[IU]/mL (ref 0.35–4.50)

## 2018-01-02 ENCOUNTER — Ambulatory Visit: Payer: BLUE CROSS/BLUE SHIELD

## 2018-01-09 ENCOUNTER — Ambulatory Visit (HOSPITAL_BASED_OUTPATIENT_CLINIC_OR_DEPARTMENT_OTHER)
Admission: RE | Admit: 2018-01-09 | Discharge: 2018-01-09 | Disposition: A | Discharge status: Home / Self Care | Payer: BLUE CROSS/BLUE SHIELD | Source: Ambulatory Visit | Attending: Family Medicine | Admitting: Family Medicine

## 2018-01-09 DIAGNOSIS — R55 Syncope and collapse: Secondary | ICD-10-CM

## 2018-01-09 DIAGNOSIS — I071 Rheumatic tricuspid insufficiency: Secondary | ICD-10-CM | POA: Diagnosis not present

## 2018-01-09 DIAGNOSIS — I10 Essential (primary) hypertension: Secondary | ICD-10-CM

## 2018-01-09 NOTE — Progress Notes (Signed)
  Echocardiogram 2D Echocardiogram has been performed.  Dorothey BasemanReel, Parth Mccormac M 01/09/2018, 9:37 AM

## 2018-01-22 ENCOUNTER — Other Ambulatory Visit (HOSPITAL_COMMUNITY)
Admission: RE | Admit: 2018-01-22 | Discharge: 2018-01-22 | Disposition: A | Discharge status: Home / Self Care | Payer: BLUE CROSS/BLUE SHIELD | Source: Ambulatory Visit | Attending: Obstetrics and Gynecology | Admitting: Obstetrics and Gynecology

## 2018-01-22 ENCOUNTER — Other Ambulatory Visit: Payer: Self-pay | Admitting: Obstetrics and Gynecology

## 2018-01-22 DIAGNOSIS — Z1231 Encounter for screening mammogram for malignant neoplasm of breast: Secondary | ICD-10-CM

## 2018-01-22 DIAGNOSIS — Z01411 Encounter for gynecological examination (general) (routine) with abnormal findings: Secondary | ICD-10-CM | POA: Diagnosis present

## 2018-01-23 LAB — CYTOLOGY - PAP
Diagnosis: NEGATIVE
HPV (WINDOPATH): NOT DETECTED

## 2018-01-23 LAB — HM PAP SMEAR

## 2018-02-12 ENCOUNTER — Ambulatory Visit: Payer: BLUE CROSS/BLUE SHIELD

## 2018-03-01 ENCOUNTER — Encounter: Payer: BLUE CROSS/BLUE SHIELD | Admitting: Family Medicine

## 2018-03-05 ENCOUNTER — Encounter: Payer: Self-pay | Admitting: Family Medicine

## 2018-04-09 ENCOUNTER — Ambulatory Visit
Admission: RE | Admit: 2018-04-09 | Discharge: 2018-04-09 | Disposition: A | Discharge status: Home / Self Care | Payer: BLUE CROSS/BLUE SHIELD | Source: Ambulatory Visit | Attending: Obstetrics and Gynecology | Admitting: Obstetrics and Gynecology

## 2018-04-09 DIAGNOSIS — Z1231 Encounter for screening mammogram for malignant neoplasm of breast: Secondary | ICD-10-CM

## 2018-04-20 ENCOUNTER — Telehealth: Payer: Self-pay | Admitting: Family Medicine

## 2018-04-20 NOTE — Telephone Encounter (Signed)
Copied from CRM [phone]. Topic: Quick Communication - See Telephone Encounter >> Apr 20, 2018 12:27 PM Ninfa MeekerPoole, Bridgett H wrote: CRM for notification. See Telephone encounter for: 04/20/18.  Left voicemail appt needs to be rescheduled per pcp.

## 2018-05-10 ENCOUNTER — Encounter: Payer: BLUE CROSS/BLUE SHIELD | Admitting: Family Medicine

## 2018-07-20 ENCOUNTER — Ambulatory Visit (INDEPENDENT_AMBULATORY_CARE_PROVIDER_SITE_OTHER): Payer: BLUE CROSS/BLUE SHIELD | Admitting: Family Medicine

## 2018-07-20 ENCOUNTER — Encounter

## 2018-07-20 ENCOUNTER — Encounter: Payer: Self-pay | Admitting: Family Medicine

## 2018-07-20 VITALS — BP 136/88 | HR 79 | Temp 98.2°F | Resp 16 | Ht 62.8 in | Wt 137.0 lb

## 2018-07-20 DIAGNOSIS — I10 Essential (primary) hypertension: Secondary | ICD-10-CM

## 2018-07-20 DIAGNOSIS — Z0000 Encounter for general adult medical examination without abnormal findings: Secondary | ICD-10-CM | POA: Diagnosis not present

## 2018-07-20 LAB — POC URINALSYSI DIPSTICK (AUTOMATED)
BILIRUBIN UA: NEGATIVE
GLUCOSE UA: POSITIVE — AB
KETONES UA: NEGATIVE
Leukocytes, UA: NEGATIVE
NITRITE UA: NEGATIVE
Protein, UA: NEGATIVE
SPEC GRAV UA: 1.02 (ref 1.010–1.025)
Urobilinogen, UA: 0.2 E.U./dL
pH, UA: 7 (ref 5.0–8.0)

## 2018-07-20 MED ORDER — NEBIVOLOL HCL 5 MG PO TABS
5.0000 mg | ORAL_TABLET | Freq: Every day | ORAL | 2 refills | Status: DC
Start: 2018-07-20 — End: 2019-04-30

## 2018-07-20 NOTE — Progress Notes (Signed)
Subjective:     Beth Glover is a 42 y.o. female and is here for a comprehensive physical exam. The patient reports no problems.  Social History   Socioeconomic History  . Marital status: Divorced    Spouse name: Not on file  . Number of children: Not on file  . Years of education: Not on file  . Highest education level: Not on file  Occupational History  . Not on file  Social Needs  . Financial resource strain: Not on file  . Food insecurity:    Worry: Not on file    Inability: Not on file  . Transportation needs:    Medical: Not on file    Non-medical: Not on file  Tobacco Use  . Smoking status: Current Some Day Smoker    Packs/day: 0.25    Types: Cigarettes  . Smokeless tobacco: Never Used  . Tobacco comment: down to 4-5 a day(12/18/17)  Substance and Sexual Activity  . Alcohol use: Yes    Comment: Occas. since + UPT,  LAST TIME HAD MARIJUANA- SEPT.Marland Kitchen.  LAST TIME HAD ALCOHOL- [place] EVE  . Drug use: Not Currently    Comment: None since + UPT  . Sexual activity: Yes  Lifestyle  . Physical activity:    Days per week: Not on file    Minutes per session: Not on file  . Stress: Not on file  Relationships  . Social connections:    Talks on phone: Not on file    Gets together: Not on file    Attends religious service: Not on file    Active member of club or organization: Not on file    Attends meetings of clubs or organizations: Not on file    Relationship status: Not on file  . Intimate partner violence:    Fear of current or ex partner: Not on file    Emotionally abused: Not on file    Physically abused: Not on file    Forced sexual activity: Not on file  Other Topics Concern  . Not on file  Social History Narrative  . Not on file   Health Maintenance  Topic Date Due  . MAMMOGRAM  04/10/2019  . PAP SMEAR-Modifier  01/23/2021  . TETANUS/TDAP  08/01/2022  . INFLUENZA VACCINE  Completed  . HIV Screening  Completed    The following portions of the patient's  history were reviewed and updated as appropriate:  She  has a past medical history of Abnormal Pap smear, Allergy, AMA (advanced maternal age) multigravida 35+, Bronchitis, Chicken pox, Chronic kidney disease, GERD (gastroesophageal reflux disease), H/O pre-eclampsia in prior pregnancy, currently pregnant, Heartburn, pyelonephritis, Hypertension, Normal pregnancy, repeat (12/31/2012), S/P cesarean section (01/02/2013), and Ureteral reflux. She does not have any pertinent problems on file. She  has a past surgical history that includes Tonsillectomy; Kidney surgery; Cesarean section (N/A, 01/02/2013); and Bilateral salpingectomy (Bilateral, 01/02/2013). Her family history includes Asthma in her sister; Breast cancer in her mother; Cancer in her father, mother, and sister; Depression in her daughter; Diabetes in her father and sister; Early death in her sister; Heart disease in her maternal grandmother; Hypertension in her sister; Mental retardation in her sister; Miscarriages / Stillbirths in her mother and sister; Stroke in her paternal grandmother. She  reports that she has been smoking cigarettes. She has been smoking about 0.25 packs per day. She has never used smokeless tobacco. She reports current alcohol use. She reports previous drug use. She has a current medication  list which includes the following prescription(s): cetirizine and nebivolol. Current Outpatient Medications on File Prior to Visit  Medication Sig Dispense Refill  . cetirizine (ZYRTEC) 10 MG tablet Take 10 mg by mouth daily.     No current facility-administered medications on file prior to visit.    She is allergic to amoxicillin; penicillins; corn-containing products; lisinopril; and amlodipine..  Review of Systems Review of Systems  Constitutional: Negative for activity change, appetite change and fatigue.  HENT: Negative for hearing loss, congestion, tinnitus and ear discharge.  dentist [id number] Eyes: Negative for visual disturbance  (see optho q1y -- vision corrected to 20/20 with glasses).  Respiratory: Negative for cough, chest tightness and shortness of breath.   Cardiovascular: Negative for chest pain, palpitations and leg swelling.  Gastrointestinal: Negative for abdominal pain, diarrhea, constipation and abdominal distention.  Genitourinary: Negative for urgency, frequency, decreased urine volume and difficulty urinating.  Musculoskeletal: Negative for back pain, arthralgias and gait problem.  Skin: Negative for color change, pallor and rash.  Neurological: Negative for dizziness, light-headedness, numbness and headaches.  Hematological: Negative for adenopathy. Does not bruise/bleed easily.  Psychiatric/Behavioral: Negative for suicidal ideas, confusion, sleep disturbance, self-injury, dysphoric mood, decreased concentration and agitation.       Objective:    BP 136/88 (BP Location: Right Arm, Cuff Size: Normal)   Pulse 79   Temp 98.2 F (36.8 C) (Oral)   Resp 16   Ht 5' 2.8" (1.595 m)   Wt 137 lb (62.1 kg)   LMP 07/16/2018   SpO2 98%   BMI 24.42 kg/m  General appearance: alert, cooperative and appears stated age Head: Normocephalic, without obvious abnormality, atraumatic Eyes: conjunctivae/corneas clear. PERRL, EOM's intact. Fundi benign. Ears: normal TM's and external ear canals both ears Nose: Nares normal. Septum midline. Mucosa normal. No drainage or sinus tenderness. Throat: lips, mucosa, and tongue normal; teeth and gums normal Neck: no adenopathy, no carotid bruit, no JVD, supple, symmetrical, trachea midline and thyroid not enlarged, symmetric, no tenderness/mass/nodules Back: symmetric, no curvature. ROM normal. No CVA tenderness. Lungs: clear to auscultation bilaterally Breasts: normal appearance, no masses or tenderness Heart: regular rate and rhythm, S1, S2 normal, no murmur, click, rub or gallop Abdomen: soft, non-tender; bowel sounds normal; no masses,  no organomegaly Pelvic:  deferred Extremities: extremities normal, atraumatic, no cyanosis or edema Pulses: 2+ and symmetric Skin: Skin color, texture, turgor normal. No rashes or lesions Lymph nodes: Cervical, supraclavicular, and axillary nodes normal. Neurologic: Alert and oriented X 3, normal strength and tone. Normal symmetric reflexes. Normal coordination and gait    Assessment:    Healthy female exam.      Plan:     ghm utd Check labs  See After Visit Summary for Counseling Recommendations    1. Annual physical exam See above - POCT Urinalysis Dipstick (Automated) - TSH; Future - Lipid panel; Future - CBC with Differential/Platelet; Future - Comprehensive metabolic panel; Future  2. Essential hypertension Poorly controlled will alter medications, encouraged DASH diet, minimize caffeine and obtain adequate sleep. Report concerning symptoms and follow up as directed and as needed - nebivolol (BYSTOLIC) 5 MG tablet; Take 1 tablet (5 mg total) by mouth daily.  Dispense: 30 tablet; Refill: 2 - CBC with Differential/Platelet; Future - Comprehensive metabolic panel; Future

## 2018-07-20 NOTE — Patient Instructions (Signed)
Preventive Care 40-64 Years, Female Preventive care refers to lifestyle choices and visits with your health care provider that can promote health and wellness. What does preventive care include?   A yearly physical exam. This is also called an annual well check.  Dental exams once or twice a year.  Routine eye exams. Ask your health care provider how often you should have your eyes checked.  Personal lifestyle choices, including: ? Daily care of your teeth and gums. ? Regular physical activity. ? Eating a healthy diet. ? Avoiding tobacco and drug use. ? Limiting alcohol use. ? Practicing safe sex. ? Taking low-dose aspirin daily starting at age 50. ? Taking vitamin and mineral supplements as recommended by your health care provider. What happens during an annual well check? The services and screenings done by your health care provider during your annual well check will depend on your age, overall health, lifestyle risk factors, and family history of disease. Counseling Your health care provider may ask you questions about your:  Alcohol use.  Tobacco use.  Drug use.  Emotional well-being.  Home and relationship well-being.  Sexual activity.  Eating habits.  Work and work environment.  Method of birth control.  Menstrual cycle.  Pregnancy history. Screening You may have the following tests or measurements:  Height, weight, and BMI.  Blood pressure.  Lipid and cholesterol levels. These may be checked every 5 years, or more frequently if you are over 50 years old.  Skin check.  Lung cancer screening. You may have this screening every year starting at age 55 if you have a 30-pack-year history of smoking and currently smoke or have quit within the past 15 years.  Colorectal cancer screening. All adults should have this screening starting at age 50 and continuing until age 75. Your health care provider may recommend screening at age 45. You will have tests every  1-10 years, depending on your results and the type of screening test. People at increased risk should start screening at an earlier age. Screening tests may include: ? Guaiac-based fecal occult blood testing. ? Fecal immunochemical test (FIT). ? Stool DNA test. ? Virtual colonoscopy. ? Sigmoidoscopy. During this test, a flexible tube with a tiny camera (sigmoidoscope) is used to examine your rectum and lower colon. The sigmoidoscope is inserted through your anus into your rectum and lower colon. ? Colonoscopy. During this test, a long, thin, flexible tube with a tiny camera (colonoscope) is used to examine your entire colon and rectum.  Hepatitis C blood test.  Hepatitis B blood test.  Sexually transmitted disease (STD) testing.  Diabetes screening. This is done by checking your blood sugar (glucose) after you have not eaten for a while (fasting). You may have this done every 1-3 years.  Mammogram. This may be done every 1-2 years. Talk to your health care provider about when you should start having regular mammograms. This may depend on whether you have a family history of breast cancer.  BRCA-related cancer screening. This may be done if you have a family history of breast, ovarian, tubal, or peritoneal cancers.  Pelvic exam and Pap test. This may be done every 3 years starting at age 21. Starting at age 30, this may be done every 5 years if you have a Pap test in combination with an HPV test.  Bone density scan. This is done to screen for osteoporosis. You may have this scan if you are at high risk for osteoporosis. Discuss your test results, treatment options,   and if necessary, the need for more tests with your health care provider. Vaccines Your health care provider may recommend certain vaccines, such as:  Influenza vaccine. This is recommended every year.  Tetanus, diphtheria, and acellular pertussis (Tdap, Td) vaccine. You may need a Td booster every 10 years.  Varicella  vaccine. You may need this if you have not been vaccinated.  Zoster vaccine. You may need this after age 38.  Measles, mumps, and rubella (MMR) vaccine. You may need at least one dose of MMR if you were born in 1957 or later. You may also need a second dose.  Pneumococcal 13-valent conjugate (PCV13) vaccine. You may need this if you have certain conditions and were not previously vaccinated.  Pneumococcal polysaccharide (PPSV23) vaccine. You may need one or two doses if you smoke cigarettes or if you have certain conditions.  Meningococcal vaccine. You may need this if you have certain conditions.  Hepatitis A vaccine. You may need this if you have certain conditions or if you travel or work in places where you may be exposed to hepatitis A.  Hepatitis B vaccine. You may need this if you have certain conditions or if you travel or work in places where you may be exposed to hepatitis B.  Haemophilus influenzae type b (Hib) vaccine. You may need this if you have certain conditions. Talk to your health care provider about which screenings and vaccines you need and how often you need them. This information is not intended to replace advice given to you by your health care provider. Make sure you discuss any questions you have with your health care provider. Document Released: 08/14/2015 Document Revised: 09/07/2017 Document Reviewed: 05/19/2015 Elsevier Interactive Patient Education  2019 [employer].

## 2018-07-23 ENCOUNTER — Telehealth: Payer: Self-pay

## 2018-07-23 NOTE — Telephone Encounter (Signed)
PA initiated via Covermymeds; KEY: [id number]. Awaiting determination.

## 2018-07-27 ENCOUNTER — Encounter: Payer: Self-pay | Admitting: *Deleted

## 2018-07-27 ENCOUNTER — Other Ambulatory Visit (INDEPENDENT_AMBULATORY_CARE_PROVIDER_SITE_OTHER): Payer: BLUE CROSS/BLUE SHIELD

## 2018-07-27 DIAGNOSIS — I10 Essential (primary) hypertension: Secondary | ICD-10-CM

## 2018-07-27 DIAGNOSIS — Z0000 Encounter for general adult medical examination without abnormal findings: Secondary | ICD-10-CM

## 2018-07-27 LAB — COMPREHENSIVE METABOLIC PANEL
AG Ratio: 1.5 (calc) (ref 1.0–2.5)
ALT: 11 U/L (ref 6–29)
AST: 14 U/L (ref 10–30)
Albumin: 4.4 g/dL (ref 3.6–5.1)
Alkaline phosphatase (APISO): 48 U/L (ref 33–115)
BUN: 11 mg/dL (ref 7–25)
CHLORIDE: 104 mmol/L (ref 98–110)
CO2: 22 mmol/L (ref 20–32)
CREATININE: 0.73 mg/dL (ref 0.50–1.10)
Calcium: 9.6 mg/dL (ref 8.6–10.2)
GLOBULIN: 3 g/dL (ref 1.9–3.7)
Glucose, Bld: 82 mg/dL (ref 65–99)
Potassium: 3.8 mmol/L (ref 3.5–5.3)
Sodium: 138 mmol/L (ref 135–146)
Total Bilirubin: 0.6 mg/dL (ref 0.2–1.2)
Total Protein: 7.4 g/dL (ref 6.1–8.1)

## 2018-07-27 LAB — LIPID PANEL
CHOL/HDL RATIO: 3.2 (calc) (ref <5.0)
CHOLESTEROL: 215 mg/dL — AB (ref <200)
HDL: 67 mg/dL (ref >50)
LDL CHOLESTEROL (CALC): 131 mg/dL — AB
Non-HDL Cholesterol (Calc): 148 mg/dL (calc) — ABNORMAL HIGH (ref <130)
Triglycerides: 76 mg/dL (ref <150)

## 2018-07-27 LAB — CBC WITH DIFFERENTIAL/PLATELET
Absolute Monocytes: 525 cells/uL (ref 200–950)
BASOS PCT: 0.5 %
Basophils Absolute: 43 cells/uL (ref 0–200)
EOS ABS: 387 {cells}/uL (ref 15–500)
EOS PCT: 4.5 %
HCT: 41.5 % (ref 35.0–45.0)
Hemoglobin: 14 g/dL (ref 11.7–15.5)
Lymphs Abs: 2700 cells/uL (ref 850–3900)
MCH: 30 pg (ref 27.0–33.0)
MCHC: 33.7 g/dL (ref 32.0–36.0)
MCV: 89.1 fL (ref 80.0–100.0)
MONOS PCT: 6.1 %
MPV: 10.1 fL (ref 7.5–12.5)
NEUTROS ABS: 4945 {cells}/uL (ref 1500–7800)
Neutrophils Relative %: 57.5 %
Platelets: 326 10*3/uL (ref 140–400)
RBC: 4.66 10*6/uL (ref 3.80–5.10)
RDW: 12.5 % (ref 11.0–15.0)
Total Lymphocyte: 31.4 %
WBC: 8.6 10*3/uL (ref 3.8–10.8)

## 2018-07-27 LAB — TSH: TSH: 1.47 mIU/L

## 2018-07-27 NOTE — Addendum Note (Signed)
Addended by: PRICE, KRISTY M on: 07/27/2018 02:50 PM   Modules accepted: Orders  

## 2018-07-27 NOTE — Addendum Note (Signed)
Addended by: Zacharie Portner M on: 07/27/2018 02:50 PM   Modules accepted: Orders  

## 2018-07-27 NOTE — Addendum Note (Signed)
Addended by: Feven Alderfer M on: 07/27/2018 02:50 PM   Modules accepted: Orders  

## 2018-07-27 NOTE — Addendum Note (Signed)
Addended by: Harley AltoPRICE, Kaipo Ardis M on: 07/27/2018 02:50 PM   Modules accepted: Orders

## 2018-07-30 NOTE — Telephone Encounter (Signed)
PA denied. Pt must try 2 of the formulary alternatives for coverage: Pt has only tried metoprolol. Other options: atenolol, bisoprolol, carvedilol.

## 2018-08-02 NOTE — Telephone Encounter (Signed)
Try coreg 3.25 bid #60  2 refills Ov in 2-3 weeks to check bp Please inform pt

## 2018-08-02 NOTE — Telephone Encounter (Signed)
Patients new insurance started yesterday, she will give new card to pharmacy to retry before we change.  She will call back if it is not covers.

## 2018-08-03 ENCOUNTER — Encounter: Payer: Self-pay | Admitting: *Deleted

## 2018-08-03 ENCOUNTER — Other Ambulatory Visit: Payer: Self-pay | Admitting: *Deleted

## 2018-08-03 DIAGNOSIS — I10 Essential (primary) hypertension: Secondary | ICD-10-CM

## 2019-04-23 ENCOUNTER — Other Ambulatory Visit: Payer: Self-pay | Admitting: Obstetrics and Gynecology

## 2019-04-23 DIAGNOSIS — Z1231 Encounter for screening mammogram for malignant neoplasm of breast: Secondary | ICD-10-CM

## 2019-04-29 ENCOUNTER — Ambulatory Visit: Payer: BLUE CROSS/BLUE SHIELD | Admitting: Family Medicine

## 2019-04-29 ENCOUNTER — Other Ambulatory Visit: Payer: Self-pay

## 2019-04-30 ENCOUNTER — Other Ambulatory Visit: Payer: Self-pay | Admitting: Family Medicine

## 2019-04-30 ENCOUNTER — Encounter: Payer: Self-pay | Admitting: Family Medicine

## 2019-04-30 ENCOUNTER — Ambulatory Visit (INDEPENDENT_AMBULATORY_CARE_PROVIDER_SITE_OTHER): Payer: Managed Care, Other (non HMO) | Admitting: Family Medicine

## 2019-04-30 VITALS — BP 118/82 | HR 96 | Temp 98.6°F | Resp 18 | Ht 62.8 in | Wt 143.2 lb

## 2019-04-30 DIAGNOSIS — R739 Hyperglycemia, unspecified: Secondary | ICD-10-CM

## 2019-04-30 DIAGNOSIS — F419 Anxiety disorder, unspecified: Secondary | ICD-10-CM | POA: Diagnosis not present

## 2019-04-30 DIAGNOSIS — E785 Hyperlipidemia, unspecified: Secondary | ICD-10-CM

## 2019-04-30 DIAGNOSIS — I10 Essential (primary) hypertension: Secondary | ICD-10-CM

## 2019-04-30 LAB — LIPID PANEL
Cholesterol: 199 mg/dL (ref 0–200)
HDL: 59.8 mg/dL (ref >39.00)
LDL Cholesterol: 129 mg/dL — ABNORMAL HIGH (ref 0–99)
NonHDL: 139.51
Total CHOL/HDL Ratio: 3
Triglycerides: 54 mg/dL (ref 0.0–149.0)
VLDL: 10.8 mg/dL (ref 0.0–40.0)

## 2019-04-30 LAB — COMPREHENSIVE METABOLIC PANEL
ALT: 10 U/L (ref 0–35)
AST: 11 U/L (ref 0–37)
Albumin: 4.3 g/dL (ref 3.5–5.2)
Alkaline Phosphatase: 47 U/L (ref 39–117)
BUN: 14 mg/dL (ref 6–23)
CO2: 25 mEq/L (ref 19–32)
Calcium: 9.8 mg/dL (ref 8.4–10.5)
Chloride: 102 mEq/L (ref 96–112)
Creatinine, Ser: 0.72 mg/dL (ref 0.40–1.20)
GFR: 87.88 mL/min (ref >60.00)
Glucose, Bld: 129 mg/dL — ABNORMAL HIGH (ref 70–99)
Potassium: 4.3 mEq/L (ref 3.5–5.1)
Sodium: 136 mEq/L (ref 135–145)
Total Bilirubin: 0.5 mg/dL (ref 0.2–1.2)
Total Protein: 7.1 g/dL (ref 6.0–8.3)

## 2019-04-30 MED ORDER — ESCITALOPRAM OXALATE 10 MG PO TABS: 10.0000 mg | ORAL_TABLET | Freq: Every day | ORAL | 2 refills | Status: DC

## 2019-04-30 NOTE — Assessment & Plan Note (Signed)
Well controlled, no changes to meds. Encouraged heart healthy diet such as the DASH diet and exercise as tolerated.  °

## 2019-04-30 NOTE — Progress Notes (Signed)
, female    DOB: [birth year]  Age: 43 y.o. MRN: [id number]    Subjective:  Subjective  HPI Beth Glover presents for f/u bp and chol.  She never started her medication She also c/o anxiety and panic attack due to covid and stress at work.    Review of Systems  Constitutional: Negative for appetite change, diaphoresis, fatigue and unexpected weight change.  Eyes: Negative for pain, redness and visual disturbance.  Respiratory: Negative for cough, chest tightness, shortness of breath and wheezing.   Cardiovascular: Positive for chest pain. Negative for palpitations and leg swelling.  Endocrine: Negative for cold intolerance, heat intolerance, polydipsia, polyphagia and polyuria.  Genitourinary: Negative for difficulty urinating, dysuria and frequency.  Neurological: Negative for dizziness, light-headedness, numbness and headaches.  Psychiatric/Behavioral: Positive for sleep disturbance. Negative for decreased concentration, dysphoric mood, self-injury and suicidal ideas. The patient is nervous/anxious.     History Past Medical History:  Diagnosis Date  . Abnormal Pap smear   . Allergy   . AMA (advanced maternal age) multigravida 29+   . Bronchitis   . Chicken pox   . Chronic kidney disease    R ureteral reflux  . GERD (gastroesophageal reflux disease)   . H/O pre-eclampsia in prior pregnancy, currently pregnant   . Heartburn   . Hx of pyelonephritis   . Hypertension   . Normal pregnancy, repeat 12/31/2012  . S/P cesarean section 01/02/2013  . Ureteral reflux    right    She has a past surgical history that includes Tonsillectomy; Kidney surgery; Cesarean section (N/A, 01/02/2013); and Bilateral salpingectomy (Bilateral, 01/02/2013).   Her family history includes Asthma in her sister; Breast cancer in her mother; Cancer in her father, mother, and sister; Depression in her daughter; Diabetes in her father and sister; Early death in her sister; Heart disease in her maternal  grandmother; Hypertension in her sister; Mental retardation in her sister; Miscarriages / Stillbirths in her mother and sister; Stroke in her paternal grandmother.She reports that she has been smoking cigarettes. She has been smoking about 0.25 packs per day. She has never used smokeless tobacco. She reports current alcohol use. She reports previous drug use.  Current Outpatient Medications on File Prior to Visit  Medication Sig Dispense Refill  . cetirizine (ZYRTEC) 10 MG tablet Take 10 mg by mouth daily.     No current facility-administered medications on file prior to visit.      Objective:  Objective  Physical Exam Vitals signs and nursing note reviewed.  Constitutional:      Appearance: She is well-developed.  HENT:     Head: Normocephalic and atraumatic.  Eyes:     Conjunctiva/sclera: Conjunctivae normal.  Neck:     Musculoskeletal: Normal range of motion and neck supple.     Thyroid: No thyromegaly.     Vascular: No carotid bruit or JVD.  Cardiovascular:     Rate and Rhythm: Normal rate and regular rhythm.     Heart sounds: Normal heart sounds. No murmur.  Pulmonary:     Effort: Pulmonary effort is normal. No respiratory distress.     Breath sounds: Normal breath sounds. No wheezing or rales.  Chest:     Chest wall: No tenderness.  Neurological:     Mental Status: She is alert and oriented to person, place, and time.  Psychiatric:        Attention and Perception: Attention normal.        Mood and Affect: Mood is anxious. Mood  is not depressed or elated. Affect is not labile, blunt, flat, angry, tearful or inappropriate.        Speech: Speech normal.        Behavior: Behavior normal.        Thought Content: Thought content normal.        Cognition and Memory: Cognition normal.     Comments: Pt describes having panic attack with a little chest pain when stressed---- goes away quickly     BP 118/82 (BP Location: Right Arm, Patient Position: Sitting, Cuff Size: Normal)    Pulse 96   Temp 98.6 F (37 C) (Temporal)   Resp 18   Ht 5' 2.8" (1.595 m)   Wt 143 lb 3.2 oz (65 kg)   SpO2 97%   BMI 25.53 kg/m  Wt Readings from Last 3 Encounters:  04/30/19 143 lb 3.2 oz (65 kg)  07/20/18 137 lb (62.1 kg)  12/18/17 133 lb 9.6 oz (60.6 kg)   Repeat bp 128/82     Lab Results  Component Value Date   WBC 8.6 07/27/2018   HGB 14.0 07/27/2018   HCT 41.5 07/27/2018   PLT 326 07/27/2018   GLUCOSE 82 07/27/2018   CHOL 215 (H) 07/27/2018   TRIG 76 07/27/2018   HDL 67 07/27/2018   LDLCALC 131 (H) 07/27/2018   ALT 11 07/27/2018   AST 14 07/27/2018   NA 138 07/27/2018   K 3.8 07/27/2018   CL 104 07/27/2018   CREATININE 0.73 07/27/2018   BUN 11 07/27/2018   CO2 22 07/27/2018   TSH 1.47 07/27/2018    Mm 3d Screen Breast Bilateral  Result Date: 04/10/2018 CLINICAL DATA:  Screening. EXAM: DIGITAL SCREENING BILATERAL MAMMOGRAM WITH TOMO AND CAD COMPARISON:  None. ACR Breast Density Category c: The breast tissue is heterogeneously dense, which may obscure small masses FINDINGS: There are no findings suspicious for malignancy. Images were processed with CAD. IMPRESSION: No mammographic evidence of malignancy. A result letter of this screening mammogram will be mailed directly to the patient. RECOMMENDATION: Screening mammogram in one year. (Code:SM-B-01Y) BI-RADS CATEGORY  1: Negative. Electronically Signed   By: Bary Richard M.D.   On: 04/10/2018 11:30     Assessment & Plan:  Plan  I have discontinued Neetu C. Doverspike's nebivolol. I am also having her start on escitalopram. Additionally, I am having her maintain her cetirizine.  Meds ordered this encounter  Medications  . escitalopram (LEXAPRO) 10 MG tablet    Sig: Take 1 tablet (10 mg total) by mouth at bedtime.    Dispense:  30 tablet    Refill:  2    Problem List Items Addressed This Visit      Unprioritized   Anxiety    Start lexapro 10 mg daily F/u 1 month or sooner prn       Relevant  Medications   escitalopram (LEXAPRO) 10 MG tablet   Essential hypertension    Well controlled, no changes to meds. Encouraged heart healthy diet such as the DASH diet and exercise as tolerated.       Hyperlipidemia - Primary    Encouraged heart healthy diet, increase exercise, avoid trans fats, consider a krill oil cap daily      Relevant Orders   Lipid panel   Comprehensive metabolic panel      Follow-up: Return in about 4 weeks (around 05/28/2019) for hypertension,anxiety.  Donato Schultz, DO

## 2019-04-30 NOTE — Assessment & Plan Note (Signed)
Start lexapro 10 mg daily  F/u 1 month or sooner prn  

## 2019-04-30 NOTE — Patient Instructions (Signed)

## 2019-04-30 NOTE — Assessment & Plan Note (Signed)
Encouraged heart healthy diet, increase exercise, avoid trans fats, consider a krill oil cap daily 

## 2019-05-06 ENCOUNTER — Other Ambulatory Visit: Payer: Self-pay | Admitting: Emergency Medicine

## 2019-05-06 NOTE — Addendum Note (Signed)
Addended by: Caffie Pinto on: 05/06/2019 08:38 AM   Modules accepted: Orders

## 2019-05-09 ENCOUNTER — Other Ambulatory Visit (INDEPENDENT_AMBULATORY_CARE_PROVIDER_SITE_OTHER): Payer: Managed Care, Other (non HMO)

## 2019-05-09 ENCOUNTER — Telehealth: Payer: Self-pay | Admitting: Family Medicine

## 2019-05-09 ENCOUNTER — Other Ambulatory Visit: Payer: Self-pay

## 2019-05-09 DIAGNOSIS — R739 Hyperglycemia, unspecified: Secondary | ICD-10-CM | POA: Diagnosis not present

## 2019-05-09 DIAGNOSIS — F419 Anxiety disorder, unspecified: Secondary | ICD-10-CM

## 2019-05-09 LAB — COMPREHENSIVE METABOLIC PANEL
ALT: 11 U/L (ref 0–35)
AST: 13 U/L (ref 0–37)
Albumin: 4.5 g/dL (ref 3.5–5.2)
Alkaline Phosphatase: 48 U/L (ref 39–117)
BUN: 16 mg/dL (ref 6–23)
CO2: 25 mEq/L (ref 19–32)
Calcium: 9.8 mg/dL (ref 8.4–10.5)
Chloride: 104 mEq/L (ref 96–112)
Creatinine, Ser: 0.78 mg/dL (ref 0.40–1.20)
GFR: 80.12 mL/min (ref >60.00)
Glucose, Bld: 99 mg/dL (ref 70–99)
Potassium: 4.4 mEq/L (ref 3.5–5.1)
Sodium: 137 mEq/L (ref 135–145)
Total Bilirubin: 0.9 mg/dL (ref 0.2–1.2)
Total Protein: 7.5 g/dL (ref 6.0–8.3)

## 2019-05-09 LAB — HEMOGLOBIN A1C: Hgb A1c MFr Bld: 5.9 % (ref 4.6–6.5)

## 2019-05-09 MED ORDER — CITALOPRAM HYDROBROMIDE 10 MG PO TABS: 10.0000 mg | ORAL_TABLET | Freq: Every day | ORAL | 2 refills | Status: DC

## 2019-05-09 NOTE — Telephone Encounter (Signed)
Pt came in office stating that went to pharmacy to get her rx for escitalopram (LEXAPRO) 10 MG tablet and stated that the cost of meds was to high, pt wants to know if there is something else that is cheaper but does the same effect as escitalopram, if there is not pt is ok to pay for this meds. Please advise.

## 2019-05-09 NOTE — Addendum Note (Signed)
Addended by: Kem Boroughs D on: 05/09/2019 12:14 PM   Modules accepted: Orders

## 2019-05-09 NOTE — Telephone Encounter (Signed)
celexa 10 mg 30  1 po qd,  2 refills

## 2019-05-09 NOTE — Telephone Encounter (Signed)
Please advise 

## 2019-05-09 NOTE — Telephone Encounter (Signed)
Patient notified.  Asked patient about corn containing allergy and she states that it is only when she ingests a large amounts, she still eat corn chips here and there with no problem.  She will still like to try medication.  Medication sent in.

## 2019-06-10 ENCOUNTER — Ambulatory Visit: Payer: Managed Care, Other (non HMO) | Admitting: Family Medicine

## 2019-06-10 ENCOUNTER — Other Ambulatory Visit: Payer: Self-pay

## 2019-06-10 VITALS — BP 110/80 | HR 70 | Temp 97.5°F | Resp 18 | Ht 63.8 in | Wt 136.8 lb

## 2019-06-10 DIAGNOSIS — F419 Anxiety disorder, unspecified: Secondary | ICD-10-CM | POA: Diagnosis not present

## 2019-06-10 MED ORDER — CITALOPRAM HYDROBROMIDE 20 MG PO TABS: 20.0000 mg | ORAL_TABLET | Freq: Every day | ORAL | 3 refills | Status: DC

## 2019-06-10 NOTE — Progress Notes (Signed)
Patient ID: Beth Glover, female    DOB: Dec 31, 1975  Age: 43 y.o. MRN: [id number]    Subjective:  Subjective  HPI Beth Glover presents for f/u anxiety.  She feels no different  Pt is not suicidal   Review of Systems  Constitutional: Negative for appetite change, diaphoresis, fatigue and unexpected weight change.  Eyes: Negative for pain, redness and visual disturbance.  Respiratory: Negative for cough, chest tightness, shortness of breath and wheezing.   Cardiovascular: Negative for chest pain, palpitations and leg swelling.  Endocrine: Negative for cold intolerance, heat intolerance, polydipsia, polyphagia and polyuria.  Genitourinary: Negative for difficulty urinating, dysuria and frequency.  Neurological: Negative for dizziness, light-headedness, numbness and headaches.    History Past Medical History:  Diagnosis Date   Abnormal Pap smear    Allergy    AMA (advanced maternal age) multigravida 35+    Bronchitis    Chicken pox    Chronic kidney disease    R ureteral reflux   GERD (gastroesophageal reflux disease)    H/O pre-eclampsia in prior pregnancy, currently pregnant    Heartburn    Hx of pyelonephritis    Hypertension    Normal pregnancy, repeat 12/31/2012   S/P cesarean section 01/02/2013   Ureteral reflux    right    She has a past surgical history that includes Tonsillectomy; Kidney surgery; Cesarean section (N/A, 01/02/2013); and Bilateral salpingectomy (Bilateral, 01/02/2013).   Her family history includes Asthma in her sister; Breast cancer in her mother; Cancer in her father, mother, and sister; Depression in her daughter; Diabetes in her father and sister; Early death in her sister; Heart disease in her maternal grandmother; Hypertension in her sister; Mental retardation in her sister; Miscarriages / Stillbirths in her mother and sister; Stroke in her paternal grandmother.She reports that she has been smoking cigarettes. She has been  smoking about 0.25 packs per day. She has never used smokeless tobacco. She reports current alcohol use. She reports previous drug use.  Current Outpatient Medications on File Prior to Visit  Medication Sig Dispense Refill   cetirizine (ZYRTEC) 10 MG tablet Take 10 mg by mouth daily.     No current facility-administered medications on file prior to visit.      Objective:  Objective  Physical Exam Vitals signs and nursing note reviewed.  Constitutional:      Appearance: She is well-developed.  HENT:     Head: Normocephalic and atraumatic.  Eyes:     Conjunctiva/sclera: Conjunctivae normal.  Neck:     Musculoskeletal: Normal range of motion and neck supple.     Thyroid: No thyromegaly.     Vascular: No carotid bruit or JVD.  Cardiovascular:     Rate and Rhythm: Normal rate and regular rhythm.     Heart sounds: Normal heart sounds. No murmur.  Pulmonary:     Effort: Pulmonary effort is normal. No respiratory distress.     Breath sounds: Normal breath sounds. No wheezing or rales.  Chest:     Chest wall: No tenderness.  Neurological:     Mental Status: She is alert and oriented to person, place, and time.  Psychiatric:        Mood and Affect: Mood is anxious. Mood is not depressed.    BP 110/80 (BP Location: Right Arm, Patient Position: Sitting, Cuff Size: Normal)    Pulse 70    Temp (!) 97.5 F (36.4 C) (Temporal)    Resp 18    Ht 5' 3.8" (  1.621 m)    Wt 136 lb 12.8 oz (62.1 kg)    SpO2 98%    BMI 23.63 kg/m  Wt Readings from Last 3 Encounters:  06/10/19 136 lb 12.8 oz (62.1 kg)  04/30/19 143 lb 3.2 oz (65 kg)  07/20/18 137 lb (62.1 kg)     Lab Results  Component Value Date   WBC 8.6 07/27/2018   HGB 14.0 07/27/2018   HCT 41.5 07/27/2018   PLT 326 07/27/2018   GLUCOSE 99 05/09/2019   CHOL 199 04/30/2019   TRIG 54.0 04/30/2019   HDL 59.80 04/30/2019   LDLCALC 129 (H) 04/30/2019   ALT 11 05/09/2019   AST 13 05/09/2019   NA 137 05/09/2019   K 4.4 05/09/2019    CL 104 05/09/2019   CREATININE 0.78 05/09/2019   BUN 16 05/09/2019   CO2 25 05/09/2019   TSH 1.47 07/27/2018   HGBA1C 5.9 05/09/2019    Mm 3d Screen Breast Bilateral  Result Date: 04/10/2018 CLINICAL DATA:  Screening. EXAM: DIGITAL SCREENING BILATERAL MAMMOGRAM WITH TOMO AND CAD COMPARISON:  None. ACR Breast Density Category c: The breast tissue is heterogeneously dense, which may obscure small masses FINDINGS: There are no findings suspicious for malignancy. Images were processed with CAD. IMPRESSION: No mammographic evidence of malignancy. A result letter of this screening mammogram will be mailed directly to the patient. RECOMMENDATION: Screening mammogram in one year. (Code:SM-B-01Y) BI-RADS CATEGORY  1: Negative. Electronically Signed   By: Bary Richard M.D.   On: 04/10/2018 11:30     Assessment & Plan:  Plan  I have discontinued Beth Glover's citalopram. I am also having her start on citalopram. Additionally, I am having her maintain her cetirizine.  Meds ordered this encounter  Medications   citalopram (CELEXA) 20 MG tablet    Sig: Take 1 tablet (20 mg total) by mouth daily.    Dispense:  30 tablet    Refill:  3    Problem List Items Addressed This Visit      Unprioritized   Anxiety - Primary   Relevant Medications   citalopram (CELEXA) 20 MG tablet      Increase celexa to 20 mg daily F/u 3 months or sooner prn  Follow-up: Return in about 3 months (around 09/10/2019), or if symptoms worsen or fail to improve, for anxiety.  Donato Schultz, DO

## 2019-06-10 NOTE — Patient Instructions (Signed)

## 2019-06-11 ENCOUNTER — Encounter: Payer: Self-pay | Admitting: Family Medicine

## 2019-06-19 ENCOUNTER — Other Ambulatory Visit: Payer: Self-pay

## 2019-06-19 ENCOUNTER — Ambulatory Visit
Admission: RE | Admit: 2019-06-19 | Discharge: 2019-06-19 | Disposition: A | Discharge status: Home / Self Care | Payer: Managed Care, Other (non HMO) | Source: Ambulatory Visit | Attending: Obstetrics and Gynecology | Admitting: Obstetrics and Gynecology

## 2019-06-19 DIAGNOSIS — Z1231 Encounter for screening mammogram for malignant neoplasm of breast: Secondary | ICD-10-CM

## 2019-09-09 ENCOUNTER — Other Ambulatory Visit: Payer: Self-pay

## 2019-09-10 ENCOUNTER — Other Ambulatory Visit: Payer: Self-pay

## 2019-09-10 ENCOUNTER — Ambulatory Visit: Payer: Managed Care, Other (non HMO) | Admitting: Family Medicine

## 2019-09-10 ENCOUNTER — Encounter: Payer: Self-pay | Admitting: Family Medicine

## 2019-09-10 VITALS — BP 110/80 | HR 91 | Temp 98.4°F | Resp 18 | Ht 63.8 in | Wt 144.4 lb

## 2019-09-10 DIAGNOSIS — F419 Anxiety disorder, unspecified: Secondary | ICD-10-CM | POA: Diagnosis not present

## 2019-09-10 DIAGNOSIS — F988 Other specified behavioral and emotional disorders with onset usually occurring in childhood and adolescence: Secondary | ICD-10-CM

## 2019-09-10 MED ORDER — LISDEXAMFETAMINE DIMESYLATE 30 MG PO CAPS: 30.0000 mg | ORAL_CAPSULE | Freq: Every day | ORAL | 0 refills | Status: DC

## 2019-09-10 NOTE — Assessment & Plan Note (Signed)
Pt feels it is more add then anxiety Will try vyvanse ----  Then consider anxiety Consider referral to [facility] if no improvement

## 2019-09-10 NOTE — Progress Notes (Signed)
Patient ID: Beth Glover, female    DOB: November 06, 1975  Age: 44 y.o. MRN: [id number]    Subjective:  Subjective  HPI Beth Glover presents for f/u anxiety and trouble focusing.  She was dx with add as a child and was on ritalin  So she really thinks the problem is more add than anxiety The celexa did nothing  Review of Systems  Constitutional: Negative for appetite change, diaphoresis, fatigue and unexpected weight change.  Eyes: Negative for pain, redness and visual disturbance.  Respiratory: Negative for cough, chest tightness, shortness of breath and wheezing.   Cardiovascular: Negative for chest pain, palpitations and leg swelling.  Endocrine: Negative for cold intolerance, heat intolerance, polydipsia, polyphagia and polyuria.  Genitourinary: Negative for difficulty urinating, dysuria and frequency.  Neurological: Negative for dizziness, light-headedness, numbness and headaches.  Psychiatric/Behavioral: Positive for decreased concentration.    History Past Medical History:  Diagnosis Date  . Abnormal Pap smear   . Allergy   . AMA (advanced maternal age) multigravida 55+   . Bronchitis   . Chicken pox   . Chronic kidney disease    R ureteral reflux  . GERD (gastroesophageal reflux disease)   . H/O pre-eclampsia in prior pregnancy, currently pregnant   . Heartburn   . Hx of pyelonephritis   . Hypertension   . Normal pregnancy, repeat 12/31/2012  . S/P cesarean section 01/02/2013  . Ureteral reflux    right    She has a past surgical history that includes Tonsillectomy; Kidney surgery; Cesarean section (N/A, 01/02/2013); and Bilateral salpingectomy (Bilateral, 01/02/2013).   Her family history includes Asthma in her sister; Breast cancer in her mother; Cancer in her father, mother, and sister; Depression in her daughter; Diabetes in her father and sister; Early death in her sister; Heart disease in her maternal grandmother; Hypertension in her sister; Mental  retardation in her sister; Miscarriages / Stillbirths in her mother and sister; Stroke in her paternal grandmother.She reports that she has been smoking cigarettes. She has been smoking about 0.25 packs per day. She has never used smokeless tobacco. She reports current alcohol use. She reports previous drug use.  Current Outpatient Medications on File Prior to Visit  Medication Sig Dispense Refill  . cetirizine (ZYRTEC) 10 MG tablet Take 10 mg by mouth daily.     No current facility-administered medications on file prior to visit.     Objective:  Objective  Physical Exam Vitals and nursing note reviewed.  Constitutional:      Appearance: She is well-developed.  HENT:     Head: Normocephalic and atraumatic.  Eyes:     Conjunctiva/sclera: Conjunctivae normal.  Neck:     Thyroid: No thyromegaly.     Vascular: No carotid bruit or JVD.  Cardiovascular:     Rate and Rhythm: Normal rate and regular rhythm.     Heart sounds: Normal heart sounds. No murmur.  Pulmonary:     Effort: Pulmonary effort is normal. No respiratory distress.     Breath sounds: Normal breath sounds. No wheezing or rales.  Chest:     Chest wall: No tenderness.  Musculoskeletal:     Cervical back: Normal range of motion and neck supple.  Neurological:     Mental Status: She is alert and oriented to person, place, and time.  Psychiatric:        Attention and Perception: Attention normal.        Mood and Affect: Mood normal.     Comments: Pt  states she has trouble getting her work done --- getting tasks finished     BP 110/80 (BP Location: Left Arm, Patient Position: Sitting, Cuff Size: Normal)   Pulse 91   Temp 98.4 F (36.9 C) (Temporal)   Resp 18   Ht 5' 3.8" (1.621 m)   Wt 144 lb 6.4 oz (65.5 kg)   SpO2 99%   BMI 24.94 kg/m  Wt Readings from Last 3 Encounters:  09/10/19 144 lb 6.4 oz (65.5 kg)  06/10/19 136 lb 12.8 oz (62.1 kg)  04/30/19 143 lb 3.2 oz (65 kg)     Lab Results  Component Value  Date   WBC 8.6 07/27/2018   HGB 14.0 07/27/2018   HCT 41.5 07/27/2018   PLT 326 07/27/2018   GLUCOSE 99 05/09/2019   CHOL 199 04/30/2019   TRIG 54.0 04/30/2019   HDL 59.80 04/30/2019   LDLCALC 129 (H) 04/30/2019   ALT 11 05/09/2019   AST 13 05/09/2019   NA 137 05/09/2019   K 4.4 05/09/2019   CL 104 05/09/2019   CREATININE 0.78 05/09/2019   BUN 16 05/09/2019   CO2 25 05/09/2019   TSH 1.47 07/27/2018   HGBA1C 5.9 05/09/2019    MM 3D SCREEN BREAST BILATERAL  Result Date: 06/19/2019 CLINICAL DATA:  Screening. EXAM: DIGITAL SCREENING BILATERAL MAMMOGRAM WITH TOMO AND CAD COMPARISON:  Previous exam(s). ACR Breast Density Category b: There are scattered areas of fibroglandular density. FINDINGS: There are no findings suspicious for malignancy. Images were processed with CAD. IMPRESSION: No mammographic evidence of malignancy. A result letter of this screening mammogram will be mailed directly to the patient. RECOMMENDATION: Screening mammogram in one year. (Code:SM-B-01Y) BI-RADS CATEGORY  1: Negative. Electronically Signed   By: Baird Lyons M.D.   On: 06/19/2019 16:41     Assessment & Plan:  Plan  I have discontinued Laela C. Kroeze's citalopram. I am also having her start on lisdexamfetamine. Additionally, I am having her maintain her cetirizine.  Meds ordered this encounter  Medications  . lisdexamfetamine (VYVANSE) 30 MG capsule    Sig: Take 1 capsule (30 mg total) by mouth daily.    Dispense:  30 capsule    Refill:  0    Problem List Items Addressed This Visit      Unprioritized   Anxiety    Pt feels it is more add then anxiety Will try vyvanse ----  Then consider anxiety Consider referral to [facility] if no improvement        Other Visit Diagnoses    Attention deficit disorder (ADD) without hyperactivity    -  Primary   Relevant Medications   lisdexamfetamine (VYVANSE) 30 MG capsule    start vyvanse--- r/u 1 month D/w pt possible side effects and when to  call   Follow-up: Return in about 4 weeks (around 10/08/2019), or if symptoms worsen or fail to improve, for ++.  Donato Schultz, DO

## 2019-09-10 NOTE — Patient Instructions (Signed)

## 2019-10-08 ENCOUNTER — Telehealth: Payer: Self-pay | Admitting: Family Medicine

## 2019-10-08 NOTE — Telephone Encounter (Signed)
Patient state that medication up until about two o'clock . Patient wasn't sure continue to take.   lisdexamfetamine (VYVANSE) 30 MG capsule [[id number]]   Please advise

## 2019-10-09 NOTE — Telephone Encounter (Signed)
Pt states medication is keeping her up to about two o'clock in the morning. Please advise

## 2019-10-09 NOTE — Telephone Encounter (Signed)
Spoke with patient. Pt is actually wanting to raise the dosage. Pt states she feels like around 2 in the afternoon the medication isn't working as well. Pt states she takes medication between 7-7:30 am. Please advise

## 2019-10-09 NOTE — Telephone Encounter (Signed)
Make sure she is taking it first thing in am ---- if she is we can lower dose to 20 mg

## 2019-10-10 ENCOUNTER — Other Ambulatory Visit: Payer: Self-pay

## 2019-10-10 ENCOUNTER — Other Ambulatory Visit: Payer: Self-pay | Admitting: Family Medicine

## 2019-10-10 DIAGNOSIS — F988 Other specified behavioral and emotional disorders with onset usually occurring in childhood and adolescence: Secondary | ICD-10-CM

## 2019-10-10 MED ORDER — LISDEXAMFETAMINE DIMESYLATE 40 MG PO CAPS: 40.0000 mg | ORAL_CAPSULE | ORAL | 0 refills | Status: DC

## 2019-10-10 NOTE — Telephone Encounter (Signed)
We can try 40 mg but normally inc the dose keeps people up longer --- if she still wants to do this I will sent it in

## 2019-10-10 NOTE — Telephone Encounter (Signed)
Patient states that she would like the increase on medication. Patient is going to reschedule appointment to April to see how she does on new medication.

## 2019-10-11 ENCOUNTER — Ambulatory Visit: Payer: Managed Care, Other (non HMO) | Admitting: Family Medicine

## 2019-11-13 ENCOUNTER — Telehealth: Payer: Self-pay | Admitting: Family Medicine

## 2019-11-13 DIAGNOSIS — F988 Other specified behavioral and emotional disorders with onset usually occurring in childhood and adolescence: Secondary | ICD-10-CM

## 2019-11-13 NOTE — Telephone Encounter (Signed)
Requesting: Vyvanse Contract: N/A UDS: N/A Last OV: 09/10/19 Next OV: 11/22/19 Last Refill: 10/10/19, #30--0 RF Database:   Please advise

## 2019-11-13 NOTE — Telephone Encounter (Signed)
Caller : Beth Glover  Call Back # [phone]  Subject: lisdexamfetamine (VYVANSE) 40 MG capsule [[id number]   Question: Should patient gest a refill for medication before dr appt.   Patient only has two pills left. Or get a refill for medication after appointment   Please advise

## 2019-11-14 ENCOUNTER — Other Ambulatory Visit: Payer: Self-pay | Admitting: Family Medicine

## 2019-11-14 DIAGNOSIS — F988 Other specified behavioral and emotional disorders with onset usually occurring in childhood and adolescence: Secondary | ICD-10-CM

## 2019-11-14 MED ORDER — LISDEXAMFETAMINE DIMESYLATE 40 MG PO CAPS: 40.0000 mg | ORAL_CAPSULE | ORAL | 0 refills | Status: DC

## 2019-11-14 NOTE — Telephone Encounter (Signed)
Refill sent---  needs f/u ov

## 2019-11-21 ENCOUNTER — Other Ambulatory Visit: Payer: Self-pay

## 2019-11-22 ENCOUNTER — Ambulatory Visit: Payer: Managed Care, Other (non HMO) | Admitting: Family Medicine

## 2019-11-22 ENCOUNTER — Encounter: Payer: Self-pay | Admitting: Family Medicine

## 2019-11-22 VITALS — BP 138/88 | HR 73 | Temp 97.1°F | Resp 18 | Ht 63.8 in | Wt 132.2 lb

## 2019-11-22 DIAGNOSIS — F988 Other specified behavioral and emotional disorders with onset usually occurring in childhood and adolescence: Secondary | ICD-10-CM | POA: Diagnosis not present

## 2019-11-22 DIAGNOSIS — Z79899 Other long term (current) drug therapy: Secondary | ICD-10-CM | POA: Diagnosis not present

## 2019-11-22 MED ORDER — LISDEXAMFETAMINE DIMESYLATE 40 MG PO CAPS: 40.0000 mg | ORAL_CAPSULE | ORAL | 0 refills | Status: DC

## 2019-11-22 NOTE — Patient Instructions (Signed)

## 2019-11-22 NOTE — Progress Notes (Signed)
Patient ID: Beth Glover, female    DOB: [birth year]  Age: 44 y.o. MRN: [id number]    Subjective:  Subjective  HPI Jkayla Glover presents for f/u add She is doing well with the vyvanse and is happy with the dose No complaints No cp, no palpitations   Review of Systems  Constitutional: Negative for appetite change, diaphoresis, fatigue and unexpected weight change.  Eyes: Negative for pain, redness and visual disturbance.  Respiratory: Negative for cough, chest tightness, shortness of breath and wheezing.   Cardiovascular: Negative for chest pain, palpitations and leg swelling.  Endocrine: Negative for cold intolerance, heat intolerance, polydipsia, polyphagia and polyuria.  Genitourinary: Negative for difficulty urinating, dysuria and frequency.  Neurological: Negative for dizziness, light-headedness, numbness and headaches.    History Past Medical History:  Diagnosis Date  . Abnormal Pap smear   . Allergy   . AMA (advanced maternal age) multigravida 43+   . Bronchitis   . Chicken pox   . Chronic kidney disease    R ureteral reflux  . GERD (gastroesophageal reflux disease)   . H/O pre-eclampsia in prior pregnancy, currently pregnant   . Heartburn   . Hx of pyelonephritis   . Hypertension   . Normal pregnancy, repeat 12/31/2012  . S/P cesarean section 01/02/2013  . Ureteral reflux    right    She has a past surgical history that includes Tonsillectomy; Kidney surgery; Cesarean section (N/A, 01/02/2013); and Bilateral salpingectomy (Bilateral, 01/02/2013).   Her family history includes Asthma in her sister; Breast cancer in her mother; Cancer in her father, mother, and sister; Depression in her daughter; Diabetes in her father and sister; Early death in her sister; Heart disease in her maternal grandmother; Hypertension in her sister; Mental retardation in her sister; Miscarriages / Stillbirths in her mother and sister; Stroke in her paternal grandmother.She  reports that she has been smoking cigarettes. She has been smoking about 0.25 packs per day. She has never used smokeless tobacco. She reports current alcohol use. She reports previous drug use.  Current Outpatient Medications on File Prior to Visit  Medication Sig Dispense Refill  . cetirizine (ZYRTEC) 10 MG tablet Take 10 mg by mouth daily.     No current facility-administered medications on file prior to visit.     Objective:  Objective  Physical Exam Vitals and nursing note reviewed.  Constitutional:      Appearance: She is well-developed.  HENT:     Head: Normocephalic and atraumatic.  Eyes:     Conjunctiva/sclera: Conjunctivae normal.  Neck:     Thyroid: No thyromegaly.     Vascular: No carotid bruit or JVD.  Cardiovascular:     Rate and Rhythm: Normal rate and regular rhythm.     Heart sounds: Normal heart sounds. No murmur.  Pulmonary:     Effort: Pulmonary effort is normal. No respiratory distress.     Breath sounds: Normal breath sounds. No wheezing or rales.  Chest:     Chest wall: No tenderness.  Musculoskeletal:     Cervical back: Normal range of motion and neck supple.  Neurological:     Mental Status: She is alert and oriented to person, place, and time.    BP 138/88 (BP Location: Right Arm, Patient Position: Sitting, Cuff Size: Normal)   Pulse 73   Temp (!) 97.1 F (36.2 C) (Temporal)   Resp 18   Ht 5' 3.8" (1.621 m)   Wt 132 lb 3.2 oz (60 kg)  SpO2 99%   BMI 22.83 kg/m  Wt Readings from Last 3 Encounters:  11/22/19 132 lb 3.2 oz (60 kg)  09/10/19 144 lb 6.4 oz (65.5 kg)  06/10/19 136 lb 12.8 oz (62.1 kg)     Lab Results  Component Value Date   WBC 8.6 07/27/2018   HGB 14.0 07/27/2018   HCT 41.5 07/27/2018   PLT 326 07/27/2018   GLUCOSE 99 05/09/2019   CHOL 199 04/30/2019   TRIG 54.0 04/30/2019   HDL 59.80 04/30/2019   LDLCALC 129 (H) 04/30/2019   ALT 11 05/09/2019   AST 13 05/09/2019   NA 137 05/09/2019   K 4.4 05/09/2019   CL 104  05/09/2019   CREATININE 0.78 05/09/2019   BUN 16 05/09/2019   CO2 25 05/09/2019   TSH 1.47 07/27/2018   HGBA1C 5.9 05/09/2019    MM 3D SCREEN BREAST BILATERAL  Result Date: 06/19/2019 CLINICAL DATA:  Screening. EXAM: DIGITAL SCREENING BILATERAL MAMMOGRAM WITH TOMO AND CAD COMPARISON:  Previous exam(s). ACR Breast Density Category b: There are scattered areas of fibroglandular density. FINDINGS: There are no findings suspicious for malignancy. Images were processed with CAD. IMPRESSION: No mammographic evidence of malignancy. A result letter of this screening mammogram will be mailed directly to the patient. RECOMMENDATION: Screening mammogram in one year. (Code:SM-B-01Y) BI-RADS CATEGORY  1: Negative. Electronically Signed   By: Baird Lyons M.D.   On: 06/19/2019 16:41     Assessment & Plan:  Plan  I am having Azarya C. Glover start on lisdexamfetamine and lisdexamfetamine. I am also having her maintain her cetirizine and lisdexamfetamine.  Meds ordered this encounter  Medications  . lisdexamfetamine (VYVANSE) 40 MG capsule    Sig: Take 1 capsule (40 mg total) by mouth every morning.    Dispense:  30 capsule    Refill:  0    Do not fill until July 2021  . lisdexamfetamine (VYVANSE) 40 MG capsule    Sig: Take 1 capsule (40 mg total) by mouth every morning.    Dispense:  30 capsule    Refill:  0    Do not fill until June 2021  . lisdexamfetamine (VYVANSE) 40 MG capsule    Sig: Take 1 capsule (40 mg total) by mouth every morning.    Dispense:  30 capsule    Refill:  0    Do not fill until May 2021    Problem List Items Addressed This Visit    None    Visit Diagnoses    High risk medication use    -  Primary   Relevant Orders   Pain Mgmt, Profile 8 w/Conf, U   Attention deficit disorder (ADD) in adult       Relevant Medications   lisdexamfetamine (VYVANSE) 40 MG capsule   lisdexamfetamine (VYVANSE) 40 MG capsule   lisdexamfetamine (VYVANSE) 40 MG capsule   Other  Relevant Orders   Pain Mgmt, Profile 8 w/Conf, U    pt is having no problems with the medicine --- no side effects   Follow-up: Return in about 6 months (around 05/23/2020), or if symptoms worsen or fail to improve.  Donato Schultz, DO

## 2019-11-27 LAB — PAIN MGMT, PROFILE 8 W/CONF, U
6 Acetylmorphine: NEGATIVE ng/mL
Alcohol Metabolites: NEGATIVE ng/mL (ref <500)
Amphetamine: 3200 ng/mL
Amphetamines: POSITIVE ng/mL
Benzodiazepines: NEGATIVE ng/mL
Buprenorphine, Urine: NEGATIVE ng/mL
Cocaine Metabolite: NEGATIVE ng/mL
Creatinine: 91.1 mg/dL
MDMA: NEGATIVE ng/mL
Marijuana Metabolite: NEGATIVE ng/mL
Methamphetamine: NEGATIVE ng/mL
Opiates: NEGATIVE ng/mL
Oxidant: NEGATIVE ug/mL
Oxycodone: NEGATIVE ng/mL
pH: 8.7 (ref 4.5–9.0)

## 2020-02-24 IMAGING — MG DIGITAL SCREENING BILAT W/ TOMO W/ CAD
8 series · 8 of 24 positions shown · non-contrast
Comparison: Previous exam(s).

CLINICAL DATA: Screening.

EXAM:
DIGITAL SCREENING BILATERAL MAMMOGRAM WITH TOMO AND CAD

[L CC synth-2D]
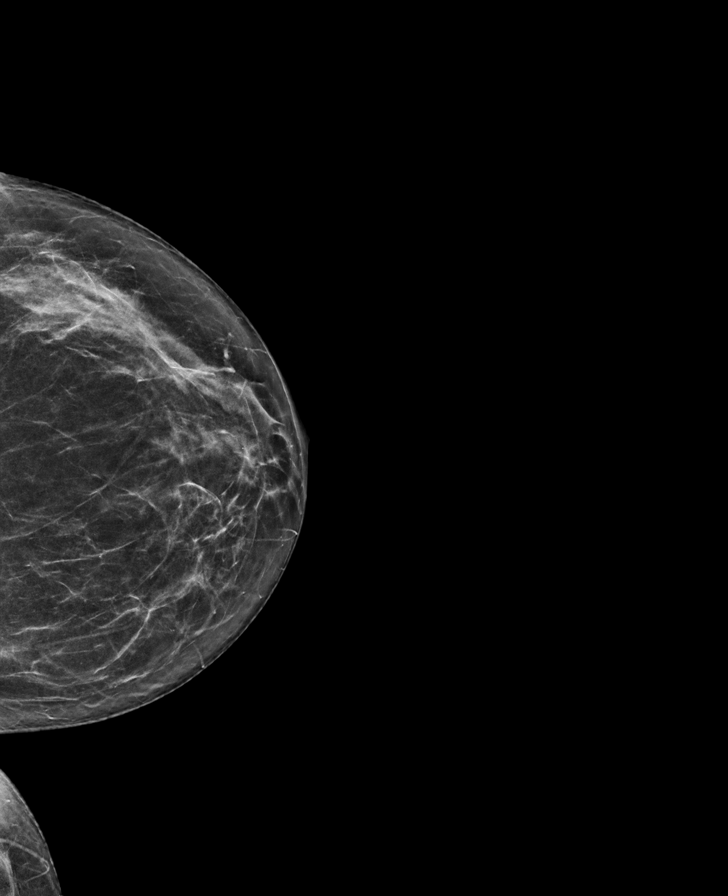

[R CC synth-2D]
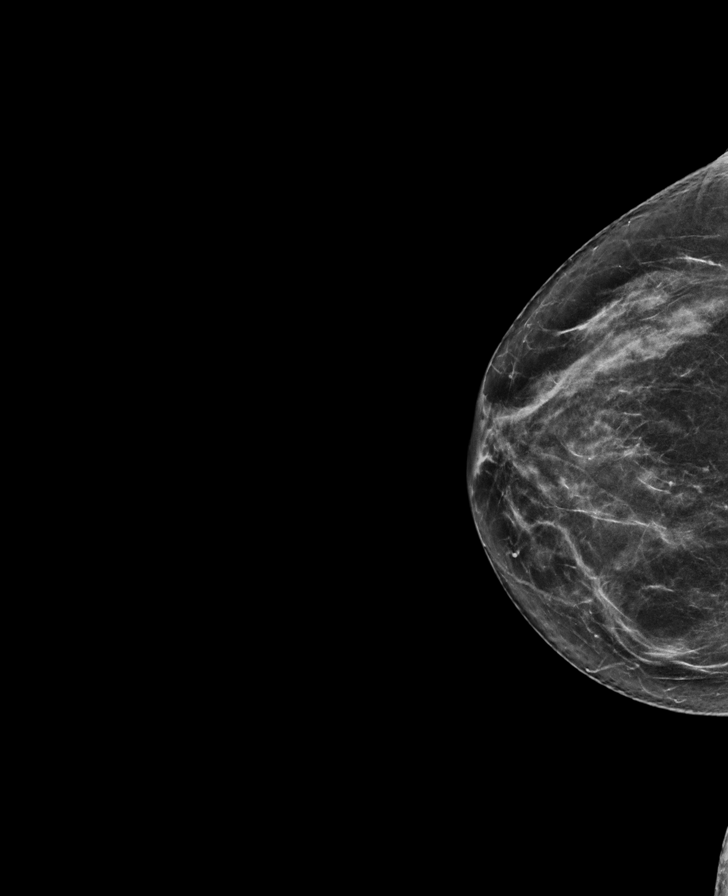

[L MLO synth-2D]
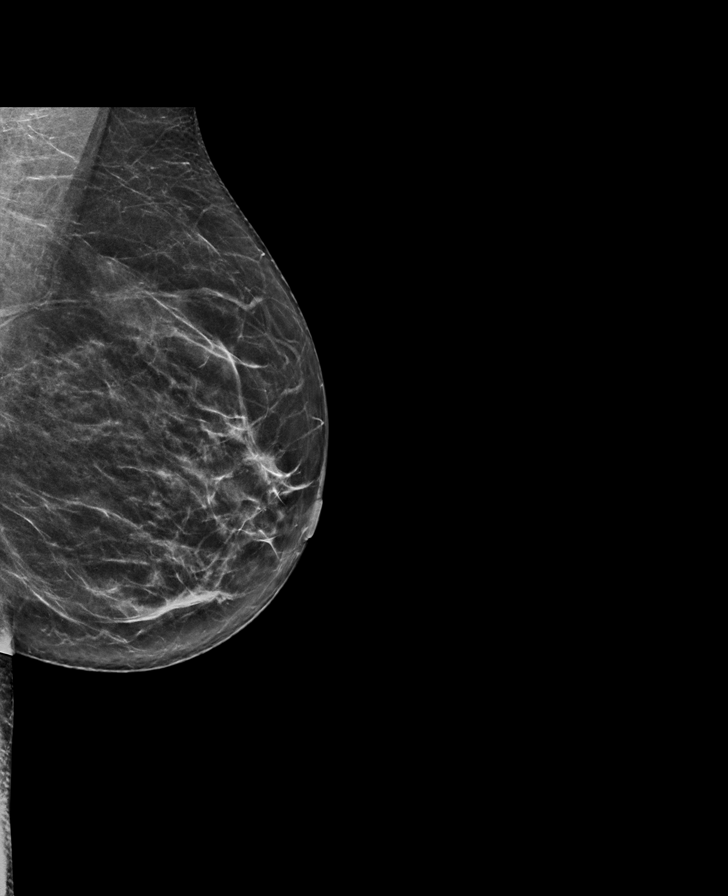

[R MLO synth-2D]
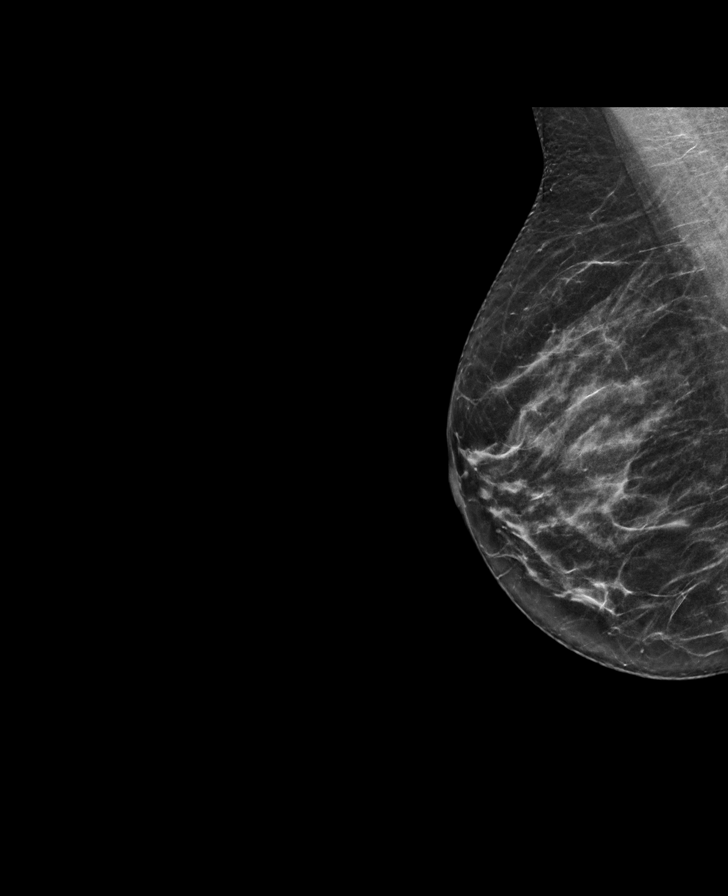

[R CC tomo · tomo slice 31/62.0]
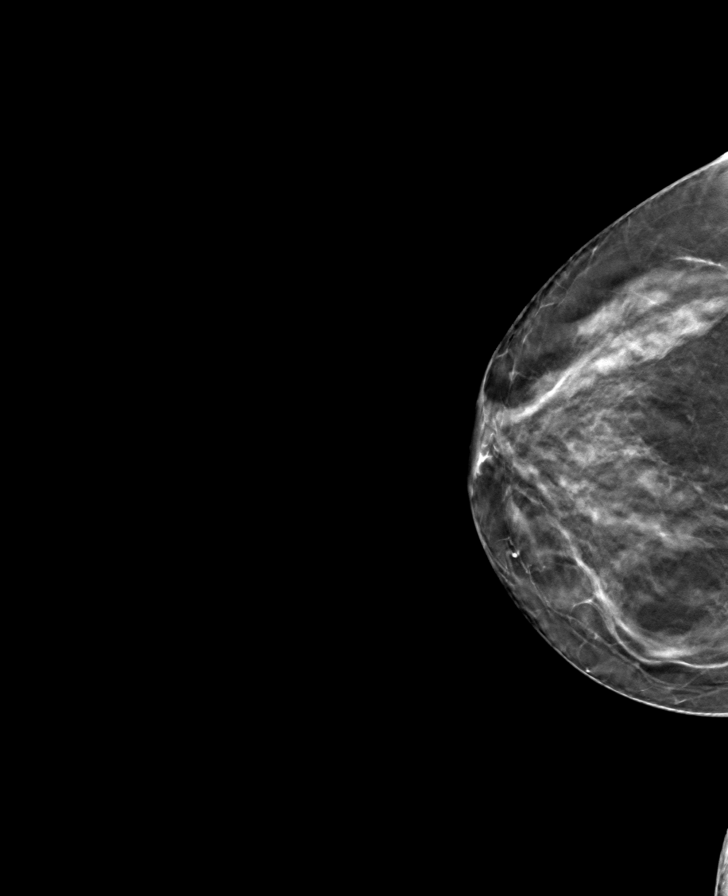

[L MLO tomo · tomo slice 32/63.0]
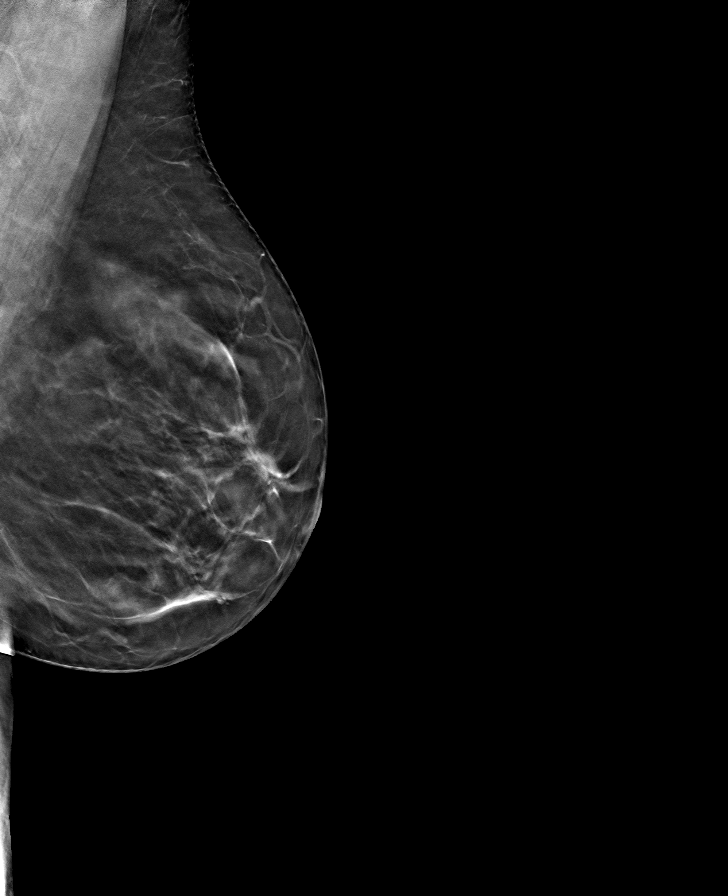

[R MLO tomo · tomo slice 31/61.0]
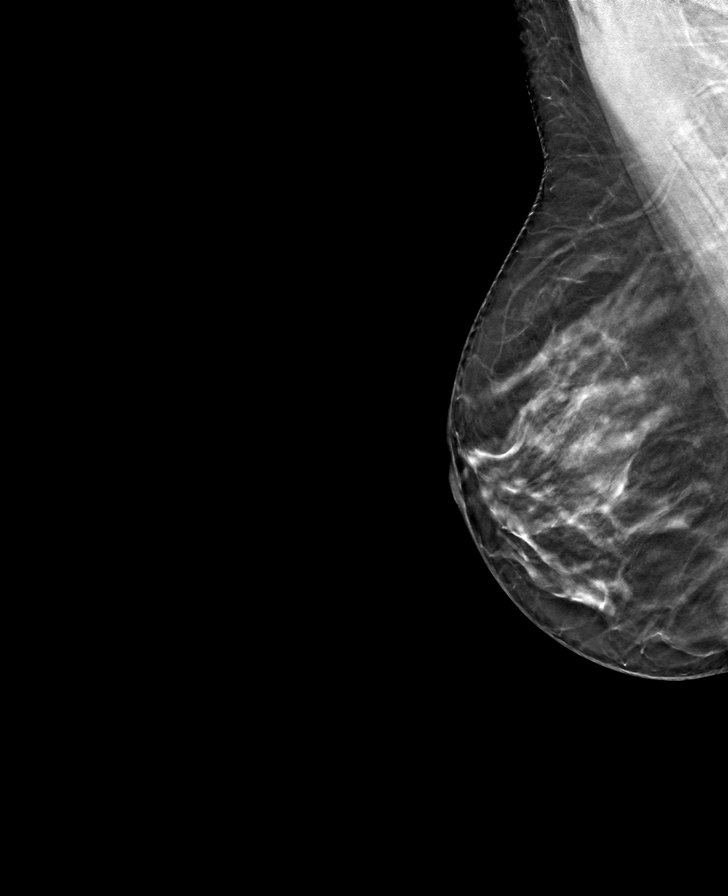

[L CC tomo · tomo slice 31/62.0]
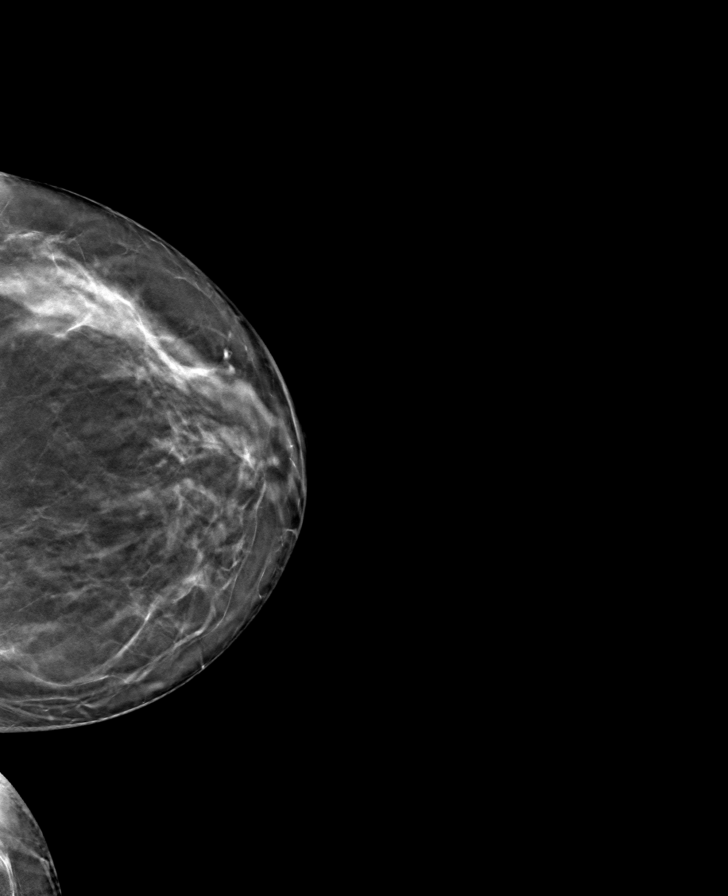

[8 of 24 positions shown; findings below may reference images not displayed]

ACR Breast Density Category b: There are scattered areas of
fibroglandular density.
FINDINGS: There are no findings suspicious for malignancy. Images were
processed with CAD.
IMPRESSION: No mammographic evidence of malignancy. A result letter of this
screening mammogram will be mailed directly to the patient.

RECOMMENDATION:
Screening mammogram in one year. (Code:CN-U-775)

BI-RADS CATEGORY  1: Negative.

## 2020-03-23 ENCOUNTER — Other Ambulatory Visit: Payer: Self-pay | Admitting: Family Medicine

## 2020-03-23 DIAGNOSIS — F988 Other specified behavioral and emotional disorders with onset usually occurring in childhood and adolescence: Secondary | ICD-10-CM

## 2020-03-23 MED ORDER — LISDEXAMFETAMINE DIMESYLATE 40 MG PO CAPS: 40.0000 mg | ORAL_CAPSULE | ORAL | 0 refills | Status: DC

## 2020-03-23 NOTE — Telephone Encounter (Signed)
Medication: lisdexamfetamine (VYVANSE) 40 MG capsule [[id number]]   Has the patient contacted their pharmacy? No. (If no, request that the patient contact the pharmacy for the refill.) (If yes, when and what did the pharmacy advise?)  Preferred Pharmacy (with phone number or street name): [phone] [place] [place] - [address]  [address] [place], [place] [address]  Phone:  [phone] Fax:  [phone]  DEA #:  [id number]  Agent: Please be advised that RX refills may take up to 3 business days. We ask that you follow-up with your pharmacy.

## 2020-03-23 NOTE — Telephone Encounter (Signed)
Requesting: Vyvanse Contract: 11/25/2019 UDS: 11/25/2019 Last OV: 11/22/2019 Next OV: 05/28/2020 Last Refill: 01/30/2020, #30--0 RF Database:   Please advise

## 2020-05-08 LAB — HM MAMMOGRAPHY

## 2020-05-28 ENCOUNTER — Encounter: Payer: Self-pay | Admitting: Family Medicine

## 2020-05-28 ENCOUNTER — Ambulatory Visit: Payer: Managed Care, Other (non HMO) | Admitting: Family Medicine

## 2020-05-28 ENCOUNTER — Other Ambulatory Visit: Payer: Self-pay

## 2020-05-28 VITALS — BP 120/70 | HR 89 | Temp 98.3°F | Resp 18 | Ht 63.8 in | Wt 119.4 lb

## 2020-05-28 DIAGNOSIS — F988 Other specified behavioral and emotional disorders with onset usually occurring in childhood and adolescence: Secondary | ICD-10-CM

## 2020-05-28 DIAGNOSIS — R0781 Pleurodynia: Secondary | ICD-10-CM

## 2020-05-28 MED ORDER — LISDEXAMFETAMINE DIMESYLATE 40 MG PO CAPS: 40.0000 mg | ORAL_CAPSULE | ORAL | 0 refills | Status: DC

## 2020-05-28 NOTE — Assessment & Plan Note (Signed)
Stable Uds/ contract utd Database reviewed  Refill meds --- vyvanse

## 2020-05-28 NOTE — Patient Instructions (Signed)
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
## 2020-08-26 ENCOUNTER — Other Ambulatory Visit: Payer: Self-pay | Admitting: Family Medicine

## 2020-08-26 DIAGNOSIS — F988 Other specified behavioral and emotional disorders with onset usually occurring in childhood and adolescence: Secondary | ICD-10-CM

## 2020-08-26 NOTE — Telephone Encounter (Signed)
Requesting: Vyvanse Contract: 11/25/2019 UDS: 11/25/2019, low risk Last OV: 05/28/2020 Next OV: 09/10/2020 Last Refill: 05/28/2020, #30--0 RF Database:   Please advise

## 2020-08-26 NOTE — Telephone Encounter (Signed)
Medication: lisdexamfetamine (VYVANSE) 40 MG capsule [[id number]]       Has the patient contacted their pharmacy?  (If no, request that the patient contact the pharmacy for the refill.) (If yes, when and what did the pharmacy advise?)     Preferred Pharmacy (with phone number or street name):  [phone] [place], [place] - [address]  [address] [place], [place] [address]  Phone:  [phone] Fax:  [phone]  DEA #:  [id number]    Agent: Please be advised that RX refills may take up to 3 business days. We ask that you follow-up with your pharmacy.

## 2020-08-27 ENCOUNTER — Telehealth: Payer: Self-pay

## 2020-08-27 MED ORDER — LISDEXAMFETAMINE DIMESYLATE 40 MG PO CAPS: 40.0000 mg | ORAL_CAPSULE | ORAL | 0 refills | Status: DC

## 2020-08-27 NOTE — Telephone Encounter (Signed)
PA initiated via Covermymeds; KEY: [id number]. PA approved.   This request has been approved using information available on the patient's profile. [id number];[id number];Review Type:Prior Auth;Coverage Start Date:08/27/2020;Coverage End Date:07/31/2098;

## 2020-09-09 ENCOUNTER — Other Ambulatory Visit: Payer: Self-pay

## 2020-09-10 ENCOUNTER — Other Ambulatory Visit: Payer: Self-pay | Admitting: *Deleted

## 2020-09-10 ENCOUNTER — Other Ambulatory Visit: Payer: Self-pay

## 2020-09-10 ENCOUNTER — Ambulatory Visit (INDEPENDENT_AMBULATORY_CARE_PROVIDER_SITE_OTHER): Payer: Managed Care, Other (non HMO) | Admitting: Family Medicine

## 2020-09-10 ENCOUNTER — Encounter: Payer: Self-pay | Admitting: Family Medicine

## 2020-09-10 VITALS — BP 102/80 | HR 93 | Temp 98.0°F | Resp 18 | Ht 63.8 in | Wt 119.8 lb

## 2020-09-10 DIAGNOSIS — Z0000 Encounter for general adult medical examination without abnormal findings: Secondary | ICD-10-CM

## 2020-09-10 DIAGNOSIS — I10 Essential (primary) hypertension: Secondary | ICD-10-CM

## 2020-09-10 DIAGNOSIS — Z1159 Encounter for screening for other viral diseases: Secondary | ICD-10-CM

## 2020-09-10 DIAGNOSIS — F988 Other specified behavioral and emotional disorders with onset usually occurring in childhood and adolescence: Secondary | ICD-10-CM

## 2020-09-10 DIAGNOSIS — E785 Hyperlipidemia, unspecified: Secondary | ICD-10-CM

## 2020-09-10 LAB — COMPREHENSIVE METABOLIC PANEL
ALT: 7 U/L (ref 0–35)
AST: 10 U/L (ref 0–37)
Albumin: 4.2 g/dL (ref 3.5–5.2)
Alkaline Phosphatase: 47 U/L (ref 39–117)
BUN: 10 mg/dL (ref 6–23)
CO2: 28 mEq/L (ref 19–32)
Calcium: 9.5 mg/dL (ref 8.4–10.5)
Chloride: 104 mEq/L (ref 96–112)
Creatinine, Ser: 0.78 mg/dL (ref 0.40–1.20)
GFR: 91.58 mL/min (ref >60.00)
Glucose, Bld: 88 mg/dL (ref 70–99)
Potassium: 4.9 mEq/L (ref 3.5–5.1)
Sodium: 140 mEq/L (ref 135–145)
Total Bilirubin: 0.8 mg/dL (ref 0.2–1.2)
Total Protein: 7.1 g/dL (ref 6.0–8.3)

## 2020-09-10 LAB — CBC WITH DIFFERENTIAL/PLATELET
Basophils Absolute: 0 10*3/uL (ref 0.0–0.1)
Basophils Relative: 0.8 % (ref 0.0–3.0)
Eosinophils Absolute: 0.4 10*3/uL (ref 0.0–0.7)
Eosinophils Relative: 7.3 % — ABNORMAL HIGH (ref 0.0–5.0)
HCT: 44.3 % (ref 36.0–46.0)
Hemoglobin: 14.7 g/dL (ref 12.0–15.0)
Lymphocytes Relative: 28.8 % (ref 12.0–46.0)
Lymphs Abs: 1.7 10*3/uL (ref 0.7–4.0)
MCHC: 33.2 g/dL (ref 30.0–36.0)
MCV: 90.3 fl (ref 78.0–100.0)
Monocytes Absolute: 0.4 10*3/uL (ref 0.1–1.0)
Monocytes Relative: 6.6 % (ref 3.0–12.0)
Neutro Abs: 3.3 10*3/uL (ref 1.4–7.7)
Neutrophils Relative %: 56.5 % (ref 43.0–77.0)
Platelets: 319 10*3/uL (ref 150.0–400.0)
RBC: 4.91 Mil/uL (ref 3.87–5.11)
RDW: 13.6 % (ref 11.5–15.5)
WBC: 5.8 10*3/uL (ref 4.0–10.5)

## 2020-09-10 LAB — TSH: TSH: 1.37 u[IU]/mL (ref 0.35–4.50)

## 2020-09-10 LAB — LIPID PANEL
Cholesterol: 205 mg/dL — ABNORMAL HIGH (ref 0–200)
HDL: 62 mg/dL (ref >39.00)
LDL Cholesterol: 130 mg/dL — ABNORMAL HIGH (ref 0–99)
NonHDL: 142.82
Total CHOL/HDL Ratio: 3
Triglycerides: 62 mg/dL (ref 0.0–149.0)
VLDL: 12.4 mg/dL (ref 0.0–40.0)

## 2020-09-10 NOTE — Progress Notes (Signed)
Subjective:     Beth Glover is a 45 y.o. female and is here for a comprehensive physical exam. The patient reports no problems.  Social History   Socioeconomic History  . Marital status: Divorced    Spouse name: Not on file  . Number of children: Not on file  . Years of education: Not on file  . Highest education level: Not on file  Occupational History  . Not on file  Tobacco Use  . Smoking status: Current Some Day Smoker    Packs/day: 0.25    Types: Cigarettes  . Smokeless tobacco: Never Used  . Tobacco comment: down to 4-5 a day(12/18/17)  Substance and Sexual Activity  . Alcohol use: Yes    Comment: Occas. since + UPT,  LAST TIME HAD MARIJUANA- SEPT.Marland Kitchen  LAST TIME HAD ALCOHOL- [place] EVE  . Drug use: Not Currently    Comment: None since + UPT  . Sexual activity: Yes  Other Topics Concern  . Not on file  Social History Narrative  . Not on file   Social Determinants of Health   Financial Resource Strain: Not on file  Food Insecurity: Not on file  Transportation Needs: Not on file  Physical Activity: Not on file  Stress: Not on file  Social Connections: Not on file  Intimate Partner Violence: Not on file   Health Maintenance  Topic Date Due  . Hepatitis C Screening  Never done  . COVID-19 Vaccine (1) Never done  . COLONOSCOPY (Pts 45-70yrs Insurance coverage will need to be confirmed)  Never done  . INFLUENZA VACCINE  03/01/2020  . PAP SMEAR-Modifier  01/23/2021  . MAMMOGRAM  05/08/2021  . TETANUS/TDAP  08/01/2022  . HIV Screening  Completed    The following portions of the patient's history were reviewed and updated as appropriate:  She  has a past medical history of Abnormal Pap smear, Allergy, AMA (advanced maternal age) multigravida 35+, Bronchitis, Chicken pox, Chronic kidney disease, GERD (gastroesophageal reflux disease), H/O pre-eclampsia in prior  pregnancy, currently pregnant, Heartburn, pyelonephritis, Hypertension, Normal pregnancy, repeat (12/31/2012), S/P cesarean section (01/02/2013), and Ureteral reflux. She does not have any pertinent problems on file. She  has a past surgical history that includes Tonsillectomy; Kidney surgery; Cesarean section (N/A, 01/02/2013); and Bilateral salpingectomy (Bilateral, 01/02/2013). Her family history includes Asthma in her sister; Breast cancer in her mother; Cancer in her father, mother, and sister; Depression in her daughter; Diabetes in her father and sister; Early death in her sister; Heart disease in her maternal grandmother; Hypertension in her sister; Mental retardation in her sister; Miscarriages / Stillbirths in her mother and sister; Stroke in her paternal grandmother. She  reports that she has been smoking cigarettes. She has been smoking about 0.25 packs per day. She has never used smokeless tobacco. She reports current alcohol use. She reports previous drug use. She has a current medication list which includes the following prescription(s): cetirizine, lisdexamfetamine, lisdexamfetamine, and lisdexamfetamine. Current Outpatient Medications on File Prior to Visit  Medication Sig Dispense Refill  . cetirizine (ZYRTEC) 10 MG tablet Take 10 mg by mouth daily.    Marland Kitchen lisdexamfetamine (VYVANSE) 40 MG capsule Take 1 capsule (40 mg total) by  mouth every morning. 30 capsule 0  . lisdexamfetamine (VYVANSE) 40 MG capsule Take 1 capsule (40 mg total) by mouth every morning. 30 capsule 0  . lisdexamfetamine (VYVANSE) 40 MG capsule Take 1 capsule (40 mg total) by mouth every morning. 30 capsule 0   No current facility-administered medications on file prior to visit.   She is allergic to amoxicillin, penicillins, corn-containing products, lisinopril, and amlodipine..  Review of Systems Review of Systems  Constitutional: Negative for activity change, appetite change and fatigue.  HENT: Negative for hearing  loss, congestion, tinnitus and ear discharge.  dentist q15m Eyes: Negative for visual disturbance (see optho q1y -- vision corrected to 20/20 with glasses).  Respiratory: Negative for cough, chest tightness and shortness of breath.   Cardiovascular: Negative for chest pain, palpitations and leg swelling.  Gastrointestinal: Negative for abdominal pain, diarrhea, constipation and abdominal distention.  Genitourinary: Negative for urgency, frequency, decreased urine volume and difficulty urinating.  Musculoskeletal: Negative for back pain, arthralgias and gait problem.  Skin: Negative for color change, pallor and rash.  Neurological: Negative for dizziness, light-headedness, numbness and headaches.  Hematological: Negative for adenopathy. Does not bruise/bleed easily.  Psychiatric/Behavioral: Negative for suicidal ideas, confusion, sleep disturbance, self-injury, dysphoric mood, decreased concentration and agitation.       Objective:    Resp 18   Ht 5' 3.8" (1.621 m)   Wt 119 lb 12.8 oz (54.3 kg)   LMP 09/01/2020   BMI 20.69 kg/m  General appearance: alert, cooperative, appears stated age and no distress Head: Normocephalic, without obvious abnormality, atraumatic Eyes: negative findings: lids and lashes normal, conjunctivae and sclerae normal and pupils equal, round, reactive to light and accomodation Ears: normal TM's and external ear canals both ears Nose: Nares normal. Septum midline. Mucosa normal. No drainage or sinus tenderness. Throat: lips, mucosa, and tongue normal; teeth and gums normal Neck: no adenopathy, no carotid bruit, no JVD, supple, symmetrical, trachea midline and thyroid not enlarged, symmetric, no tenderness/mass/nodules Back: symmetric, no curvature. ROM normal. No CVA tenderness. Lungs: clear to auscultation bilaterally Breasts: GYN Heart: regular rate and rhythm, S1, S2 normal, no murmur, click, rub or gallop Abdomen: soft, non-tender; bowel sounds normal; no  masses,  no organomegaly Pelvic: deferred--GYN Extremities: extremities normal, atraumatic, no cyanosis or edema Pulses: 2+ and symmetric Skin: Skin color, texture, turgor normal. No rashes or lesions Lymph nodes: Cervical, supraclavicular, and axillary nodes normal. Neurologic: Alert and oriented X 3, normal strength and tone. Normal symmetric reflexes. Normal coordination and gait    Assessment:    Healthy female exam.      Plan:    [facility] UTD CHECK LABS  See After Visit Summary for Counseling Recommendations    1. Preventative health care SEE ABOVE  - Lipid panel - CBC with Differential/Platelet - TSH - Comprehensive metabolic panel  2. Attention deficit disorder (ADD) in adult STABLE CON'T MEDS CONTRACT AND UDS UPDATED   3. Need for hepatitis C screening test  - Hepatitis C antibody

## 2020-09-10 NOTE — Patient Instructions (Signed)
Preventive Care 84-46 Years Old, Female Preventive care refers to lifestyle choices and visits with your health care provider that can promote health and wellness. This includes:  A yearly physical exam. This is also called an annual wellness visit.  Regular dental and eye exams.  Immunizations.  Screening for certain conditions.  Healthy lifestyle choices, such as: ? Eating a healthy diet. ? Getting regular exercise. ? Not using drugs or products that contain nicotine and tobacco. ? Limiting alcohol use. What can I expect for my preventive care visit? Physical exam Your health care provider will check your:  Height and weight. These may be used to calculate your BMI (body mass index). BMI is a measurement that tells if you are at a healthy weight.  Heart rate and blood pressure.  Body temperature.  Skin for abnormal spots. Counseling Your health care provider may ask you questions about your:  Past medical problems.  Family's medical history.  Alcohol, tobacco, and drug use.  Emotional well-being.  Home life and relationship well-being.  Sexual activity.  Diet, exercise, and sleep habits.  Work and work Statistician.  Access to firearms.  Method of birth control.  Menstrual cycle.  Pregnancy history. What immunizations do I need? Vaccines are usually given at various ages, according to a schedule. Your health care provider will recommend vaccines for you based on your age, medical history, and lifestyle or other factors, such as travel or where you work.   What tests do I need? Blood tests  Lipid and cholesterol levels. These may be checked every 5 years, or more often if you are over 3 years old.  Hepatitis C test.  Hepatitis B test. Screening  Lung cancer screening. You may have this screening every year starting at age 46 if you have a 30-pack-year history of smoking and currently smoke or have quit within the past 15 years.  Colorectal cancer  screening. ? All adults should have this screening starting at age 46 and continuing until age 46. ? Your health care provider may recommend screening at age 46 if you are at increased risk. ? You will have tests every 1-10 years, depending on your results and the type of screening test.  Diabetes screening. ? This is done by checking your blood sugar (glucose) after you have not eaten for a while (fasting). ? You may have this done every 1-3 years.  Mammogram. ? This may be done every 1-2 years. ? Talk with your health care provider about when you should start having regular mammograms. This may depend on whether you have a family history of breast cancer.  BRCA-related cancer screening. This may be done if you have a family history of breast, ovarian, tubal, or peritoneal cancers.  Pelvic exam and Pap test. ? This may be done every 3 years starting at age 46. ? Starting at age 46, this may be done every 5 years if you have a Pap test in combination with an HPV test. Other tests  STD (sexually transmitted disease) testing, if you are at risk.  Bone density scan. This is done to screen for osteoporosis. You may have this scan if you are at high risk for osteoporosis. Talk with your health care provider about your test results, treatment options, and if necessary, the need for more tests. Follow these instructions at home: Eating and drinking  Eat a diet that includes fresh fruits and vegetables, whole grains, lean protein, and low-fat dairy products.  Take vitamin and mineral supplements  as recommended by your health care provider.  Do not drink alcohol if: ? Your health care provider tells you not to drink. ? You are pregnant, may be pregnant, or are planning to become pregnant.  If you drink alcohol: ? Limit how much you have to 0-1 drink a day. ? Be aware of how much alcohol is in your drink. In the [place] one drink equals one 12 oz bottle of beer (355 mL), one 5 oz glass of  wine (148 mL), or one 1 oz glass of hard liquor (44 mL).   Lifestyle  Take daily care of your teeth and gums. Brush your teeth every morning and night with fluoride toothpaste. Floss one time each day.  Stay active. Exercise for at least 30 minutes 5 or more days each week.  Do not use any products that contain nicotine or tobacco, such as cigarettes, e-cigarettes, and chewing tobacco. If you need help quitting, ask your health care provider.  Do not use drugs.  If you are sexually active, practice safe sex. Use a condom or other form of protection to prevent STIs (sexually transmitted infections).  If you do not wish to become pregnant, use a form of birth control. If you plan to become pregnant, see your health care provider for a prepregnancy visit.  If told by your health care provider, take low-dose aspirin daily starting at age 50.  Find healthy ways to cope with stress, such as: ? Meditation, yoga, or listening to music. ? Journaling. ? Talking to a trusted person. ? Spending time with friends and family. Safety  Always wear your seat belt while driving or riding in a vehicle.  Do not drive: ? If you have been drinking alcohol. Do not ride with someone who has been drinking. ? When you are tired or distracted. ? While texting.  Wear a helmet and other protective equipment during sports activities.  If you have firearms in your house, make sure you follow all gun safety procedures. What's next?  Visit your health care provider once a year for an annual wellness visit.  Ask your health care provider how often you should have your eyes and teeth checked.  Stay up to date on all vaccines. This information is not intended to replace advice given to you by your health care provider. Make sure you discuss any questions you have with your health care provider. Document Revised: 04/21/2020 Document Reviewed: 03/29/2018 Elsevier Patient Education  2021 Elsevier Inc.  

## 2020-09-11 LAB — HEPATITIS C ANTIBODY
Hepatitis C Ab: NONREACTIVE
SIGNAL TO CUT-OFF: 0.01 (ref <1.00)

## 2020-09-15 ENCOUNTER — Telehealth: Payer: Self-pay | Admitting: Family Medicine

## 2020-09-15 NOTE — Telephone Encounter (Signed)
Patient is requesting a call back in reference to her lab results, patient would like to go over results with CMA.  Please Advise

## 2020-09-16 NOTE — Telephone Encounter (Signed)
Usually related to allergies

## 2020-09-16 NOTE — Telephone Encounter (Signed)
Spoke with patient. Pt states not showing any signs of infection or sinus concerns. Advised patient to contact us if sxs begin to show.

## 2020-09-16 NOTE — Telephone Encounter (Signed)
Spoke with patient. Pt was wondering why her Eosinophils Relative is high and what does it mean? Please advise

## 2020-10-26 ENCOUNTER — Other Ambulatory Visit: Payer: Self-pay | Admitting: Family Medicine

## 2020-10-26 DIAGNOSIS — F988 Other specified behavioral and emotional disorders with onset usually occurring in childhood and adolescence: Secondary | ICD-10-CM

## 2020-10-26 MED ORDER — LISDEXAMFETAMINE DIMESYLATE 40 MG PO CAPS: 40.0000 mg | ORAL_CAPSULE | ORAL | 0 refills | Status: DC

## 2020-10-26 NOTE — Telephone Encounter (Signed)
RequestingRubbie Battiest  Contract: 11/25/2019 UDS: 11/25/2019 Last OV: 09/10/2020 Next OV: 03/11/2021 Last Refill: 08/27/2020, #30--0 RF Database:   Please advise

## 2020-10-26 NOTE — Telephone Encounter (Signed)
Medication: lisdexamfetamine (VYVANSE) 40 MG capsule   Has the patient contacted their pharmacy? Yes.   (If no, request that the patient contact the pharmacy for the refill.) (If yes, when and what did the pharmacy advise?) Call PCP   Preferred Pharmacy (with phone number or street name):  [facility] [phone] - [place], [place] - [address] Phone:  [phone]  Fax:  [phone]      Agent: Please be advised that RX refills may take up to 3 business days. We ask that you follow-up with your pharmacy.

## 2020-11-27 ENCOUNTER — Encounter: Payer: Managed Care, Other (non HMO) | Admitting: Family Medicine

## 2020-11-27 ENCOUNTER — Other Ambulatory Visit: Payer: Self-pay | Admitting: Family Medicine

## 2020-11-27 DIAGNOSIS — F988 Other specified behavioral and emotional disorders with onset usually occurring in childhood and adolescence: Secondary | ICD-10-CM

## 2020-11-27 MED ORDER — LISDEXAMFETAMINE DIMESYLATE 40 MG PO CAPS: 40.0000 mg | ORAL_CAPSULE | ORAL | 0 refills | Status: DC

## 2020-11-27 NOTE — Telephone Encounter (Signed)
Patient would like a 90day supply sent to pharmacy.   Medication: lisdexamfetamine (VYVANSE) 40 MG capsule   Has the patient contacted their pharmacy? No. (If no, request that the patient contact the pharmacy for the refill.) (If yes, when and what did the pharmacy advise?)  Preferred Pharmacy (with phone number or street name):  [facility] [phone] - [place], [place] - [address]  [address] [place], [place] [address]  Phone:  [phone] Fax:  [phone]  DEA #:  [id number]       Agent: Please be advised that RX refills may take up to 3 business days. We ask that you follow-up with your pharmacy.

## 2020-11-27 NOTE — Telephone Encounter (Signed)
Requesting:Vvanse Contract:11/25/2019 UDS:11/22/2019 Last Visit:09/10/2020 Next Visit:03/11/2021 Last Refill:10/26/2020  Please Advise

## 2020-12-08 ENCOUNTER — Other Ambulatory Visit: Payer: Managed Care, Other (non HMO)

## 2020-12-11 ENCOUNTER — Other Ambulatory Visit (INDEPENDENT_AMBULATORY_CARE_PROVIDER_SITE_OTHER): Payer: Managed Care, Other (non HMO)

## 2020-12-11 ENCOUNTER — Other Ambulatory Visit: Payer: Self-pay

## 2020-12-11 DIAGNOSIS — E785 Hyperlipidemia, unspecified: Secondary | ICD-10-CM

## 2020-12-11 NOTE — Addendum Note (Signed)
Addended by: Mervin Kung A on: 12/11/2020 08:45 AM   Modules accepted: Orders

## 2020-12-17 ENCOUNTER — Other Ambulatory Visit (INDEPENDENT_AMBULATORY_CARE_PROVIDER_SITE_OTHER): Payer: Managed Care, Other (non HMO)

## 2020-12-17 ENCOUNTER — Other Ambulatory Visit: Payer: Self-pay

## 2020-12-17 DIAGNOSIS — I10 Essential (primary) hypertension: Secondary | ICD-10-CM | POA: Diagnosis not present

## 2020-12-17 DIAGNOSIS — E785 Hyperlipidemia, unspecified: Secondary | ICD-10-CM

## 2020-12-17 LAB — COMPREHENSIVE METABOLIC PANEL
ALT: 11 U/L (ref 0–35)
AST: 11 U/L (ref 0–37)
Albumin: 4.1 g/dL (ref 3.5–5.2)
Alkaline Phosphatase: 68 U/L (ref 39–117)
BUN: 11 mg/dL (ref 6–23)
CO2: 25 mEq/L (ref 19–32)
Calcium: 9.1 mg/dL (ref 8.4–10.5)
Chloride: 100 mEq/L (ref 96–112)
Creatinine, Ser: 0.74 mg/dL (ref 0.40–1.20)
GFR: 97.37 mL/min (ref >60.00)
Glucose, Bld: 94 mg/dL (ref 70–99)
Potassium: 4.7 mEq/L (ref 3.5–5.1)
Sodium: 133 mEq/L — ABNORMAL LOW (ref 135–145)
Total Bilirubin: 0.7 mg/dL (ref 0.2–1.2)
Total Protein: 7.4 g/dL (ref 6.0–8.3)

## 2020-12-17 LAB — LIPID PANEL
Cholesterol: 228 mg/dL — ABNORMAL HIGH (ref 0–200)
HDL: 71.3 mg/dL (ref >39.00)
LDL Cholesterol: 143 mg/dL — ABNORMAL HIGH (ref 0–99)
NonHDL: 157.09
Total CHOL/HDL Ratio: 3
Triglycerides: 70 mg/dL (ref 0.0–149.0)
VLDL: 14 mg/dL (ref 0.0–40.0)

## 2020-12-18 ENCOUNTER — Other Ambulatory Visit: Payer: Self-pay | Admitting: Family Medicine

## 2020-12-18 ENCOUNTER — Encounter: Payer: Self-pay | Admitting: Family Medicine

## 2020-12-18 ENCOUNTER — Ambulatory Visit: Payer: Managed Care, Other (non HMO) | Admitting: Family Medicine

## 2020-12-18 DIAGNOSIS — E785 Hyperlipidemia, unspecified: Secondary | ICD-10-CM

## 2020-12-18 DIAGNOSIS — D721 Eosinophilia, unspecified: Secondary | ICD-10-CM

## 2020-12-18 NOTE — Telephone Encounter (Signed)
I did not do that yesterday---- eosinophils are normally related to allergies and this time of year i'd expect them to be slightly elevated in a lot of people---- I have placed the order for repeat labs for 3-8months and have added the cbcd to that lab---- if schedule a lab app for 3-6 months we will get it done then    thanks    Dr Vladimir Crofts

## 2020-12-30 ENCOUNTER — Other Ambulatory Visit: Payer: Self-pay | Admitting: Family Medicine

## 2020-12-30 ENCOUNTER — Telehealth: Payer: Self-pay | Admitting: Family Medicine

## 2020-12-30 DIAGNOSIS — F988 Other specified behavioral and emotional disorders with onset usually occurring in childhood and adolescence: Secondary | ICD-10-CM

## 2020-12-30 MED ORDER — LISDEXAMFETAMINE DIMESYLATE 40 MG PO CAPS: 40.0000 mg | ORAL_CAPSULE | ORAL | 0 refills | Status: DC

## 2020-12-30 NOTE — Telephone Encounter (Signed)
Patient is requesting a 3 month supply Medication: lisdexamfetamine (VYVANSE) 40 MG capsule [[id number]]       Has the patient contacted their pharmacy?  (If no, request that the patient contact the pharmacy for the refill.) (If yes, when and what did the pharmacy advise?)     Preferred Pharmacy (with phone number or street name):  [phone] [place], [place] - [address]  [address] [place], [place] [address]  Phone:  [phone] Fax:  [phone]     Agent: Please be advised that RX refills may take up to 3 business days. We ask that you follow-up with your pharmacy.

## 2020-12-30 NOTE — Telephone Encounter (Signed)
Requesting: Vynanse 40 mg Contract: 11/25/2019 UDS: 11/25/2019 Last Visit: 09/10/2020 Next Visit: 01/05/2021 Last Refill: 11/27/2020

## 2020-12-30 NOTE — Telephone Encounter (Signed)
done

## 2021-01-05 ENCOUNTER — Other Ambulatory Visit (HOSPITAL_COMMUNITY)
Admission: RE | Admit: 2021-01-05 | Discharge: 2021-01-05 | Disposition: A | Discharge status: Home / Self Care | Payer: Managed Care, Other (non HMO) | Source: Ambulatory Visit | Attending: Family Medicine | Admitting: Family Medicine

## 2021-01-05 ENCOUNTER — Encounter: Payer: Self-pay | Admitting: Family Medicine

## 2021-01-05 ENCOUNTER — Ambulatory Visit: Payer: Managed Care, Other (non HMO) | Admitting: Family Medicine

## 2021-01-05 ENCOUNTER — Other Ambulatory Visit: Payer: Self-pay

## 2021-01-05 VITALS — BP 124/82 | HR 85 | Temp 98.6°F | Resp 18 | Ht 63.8 in | Wt 129.8 lb

## 2021-01-05 DIAGNOSIS — Z0000 Encounter for general adult medical examination without abnormal findings: Secondary | ICD-10-CM | POA: Insufficient documentation

## 2021-01-05 DIAGNOSIS — Z01419 Encounter for gynecological examination (general) (routine) without abnormal findings: Secondary | ICD-10-CM | POA: Diagnosis not present

## 2021-01-05 NOTE — Patient Instructions (Signed)

## 2021-01-05 NOTE — Progress Notes (Signed)
Subjective:   By signing my name below, I, Shehryar Baig, attest that this documentation has been prepared under the direction and in the presence of Dr. Seabron Spates, DO. 01/05/2021      Patient ID: Beth Glover, female    DOB: 1976-05-15, 45 y.o.   MRN: [id number]  Chief Complaint  Patient presents with  . Gynecologic Exam    HPI Patient is in today for a office visit.   She is doing well at this time. She is requesting a pap smear. She is interested in screening for a HPV. She last had a mammogram 6 months ago. She had a irregular menstrual cycle recently. She did not have a menstrual cycle for a month and then had episodes of hot flashes. Her sister had her menopause at an early age.    Past Medical History:  Diagnosis Date  . Abnormal Pap smear   . Allergy   . AMA (advanced maternal age) multigravida 35+   . Bronchitis   . Chicken pox   . Chronic kidney disease    R ureteral reflux  . GERD (gastroesophageal reflux disease)   . H/O pre-eclampsia in prior pregnancy, currently pregnant   . Heartburn   . Hx of pyelonephritis   . Hypertension   . Normal pregnancy, repeat 12/31/2012  . S/P cesarean section 01/02/2013  . Ureteral reflux    right    Past Surgical History:  Procedure Laterality Date  . BILATERAL SALPINGECTOMY Bilateral 01/02/2013   Procedure: BILATERAL SALPINGECTOMY;  Surgeon: Sherron Monday, MD;  Location: [facility];  Service: Obstetrics;  Laterality: Bilateral;  . CESAREAN SECTION N/A 01/02/2013   Procedure: CESAREAN SECTION;  Surgeon: Sherron Monday, MD;  Location: [facility];  Service: Obstetrics;  Laterality: N/A;  Primary Cesarean Section Delivery Baby Boy @ 0410, Apgars   . KIDNEY SURGERY     Correct R ureteral reflux, 2002  . TONSILLECTOMY      Family History  Problem Relation Age of Onset  . Cancer Mother   . Miscarriages / [place] Mother   . Breast cancer Mother   . Cancer Father   . Diabetes Father   . Diabetes Sister   . Miscarriages  / Stillbirths Sister   . Depression Daughter   . Heart disease Maternal Grandmother   . Stroke Paternal Grandmother   . Cancer Sister   . Hypertension Sister   . Early death Sister   . Asthma Sister   . Mental retardation Sister     Social History   Socioeconomic History  . Marital status: Divorced    Spouse name: Not on file  . Number of children: Not on file  . Years of education: Not on file  . Highest education level: Not on file  Occupational History  . Not on file  Tobacco Use  . Smoking status: Current Some Day Smoker    Packs/day: 0.25    Types: Cigarettes  . Smokeless tobacco: Never Used  . Tobacco comment: down to 4-5 a day(12/18/17)  Substance and Sexual Activity  . Alcohol use: Yes    Comment: Occas. since + UPT,  LAST TIME HAD MARIJUANA- SEPT.Marland Kitchen  LAST TIME HAD ALCOHOL- [place] EV[facility]  . Drug use: Not Currently    Comment: None since + UPT  . Sexual activity: Yes  Other Topics Concern  . Not on file  Social History Narrative  . Not on file   Social Determinants of Health   Financial Resource Strain: Not on file  Food Insecurity: Not on file  Transportation Needs: Not on file  Physical Activity: Not on file  Stress: Not on file  Social Connections: Not on file  Intimate Partner Violence: Not on file    Outpatient Medications Prior to Visit  Medication Sig Dispense Refill  . cetirizine (ZYRTEC) 10 MG tablet Take 10 mg by mouth daily.    Marland Kitchen lisdexamfetamine (VYVANSE) 40 MG capsule Take 1 capsule (40 mg total) by mouth every morning. 30 capsule 0  . lisdexamfetamine (VYVANSE) 40 MG capsule Take 1 capsule (40 mg total) by mouth every morning. 30 capsule 0  . lisdexamfetamine (VYVANSE) 40 MG capsule Take 1 capsule (40 mg total) by mouth every morning. 30 capsule 0   No facility-administered medications prior to visit.    Allergies  Allergen Reactions  . Amoxicillin Hives, Shortness Of Breath and Swelling    Can't take any cilllins  . Penicillins Hives,  Shortness Of Breath and Swelling  . Corn-Containing Products   . Lisinopril Other (See Comments)    Throat itching  . Amlodipine Palpitations    Review of Systems  Constitutional: Negative for chills, fever and malaise/fatigue.  HENT: Negative for congestion and hearing loss.   Eyes: Negative for discharge.  Respiratory: Negative for cough, sputum production and shortness of breath.   Cardiovascular: Negative for chest pain, palpitations and leg swelling.  Gastrointestinal: Negative for abdominal pain, blood in stool, constipation, diarrhea, heartburn, nausea and vomiting.  Genitourinary: Negative for dysuria, frequency, hematuria and urgency.  Musculoskeletal: Negative for back pain, falls and myalgias.  Skin: Negative for rash.  Neurological: Negative for dizziness, sensory change, loss of consciousness, weakness and headaches.  Endo/Heme/Allergies: Negative for environmental allergies. Does not bruise/bleed easily.  Psychiatric/Behavioral: Negative for depression and suicidal ideas. The patient is not nervous/anxious and does not have insomnia.        Objective:    Physical Exam Vitals and nursing note reviewed.  Constitutional:      Appearance: Normal appearance.  HENT:     Head: Normocephalic and atraumatic.  Genitourinary:    General: Normal vulva.     Labia:        Right: No rash, tenderness, lesion or injury.        Left: No rash, tenderness, lesion or injury.      Vagina: No signs of injury and foreign body. No vaginal discharge, erythema, tenderness, bleeding, lesions or prolapsed vaginal walls.     Cervix: No cervical motion tenderness, discharge, friability, lesion, erythema, cervical bleeding or eversion.     Uterus: Normal. Not deviated, not enlarged, not fixed, not tender and no uterine prolapse.      Adnexa: Right adnexa normal and left adnexa normal.     Rectum: Normal. Guaiac result negative.     Comments: Pap smear exam was completed  normally. Neurological:     Mental Status: She is alert and oriented to person, place, and time.  Psychiatric:        Behavior: Behavior normal.     BP 124/82 (BP Location: Right Arm, Patient Position: Sitting, Cuff Size: Normal)   Pulse 85   Temp 98.6 F (37 C) (Oral)   Resp 18   Ht 5' 3.8" (1.621 m)   Wt 129 lb 12.8 oz (58.9 kg)   SpO2 99%   BMI 22.42 kg/m  Wt Readings from Last 3 Encounters:  01/05/21 129 lb 12.8 oz (58.9 kg)  09/10/20 119 lb 12.8 oz (54.3 kg)  05/28/20 119 lb 6.4 oz (  54.2 kg)    Diabetic Foot Exam - Simple   No data filed    Lab Results  Component Value Date   WBC 5.8 09/10/2020   HGB 14.7 09/10/2020   HCT 44.3 09/10/2020   PLT 319.0 09/10/2020   GLUCOSE 94 12/17/2020   CHOL 228 (H) 12/17/2020   TRIG 70.0 12/17/2020   HDL 71.30 12/17/2020   LDLCALC 143 (H) 12/17/2020   ALT 11 12/17/2020   AST 11 12/17/2020   NA 133 (L) 12/17/2020   K 4.7 12/17/2020   CL 100 12/17/2020   CREATININE 0.74 12/17/2020   BUN 11 12/17/2020   CO2 25 12/17/2020   TSH 1.37 09/10/2020   HGBA1C 5.9 05/09/2019    Lab Results  Component Value Date   TSH 1.37 09/10/2020   Lab Results  Component Value Date   WBC 5.8 09/10/2020   HGB 14.7 09/10/2020   HCT 44.3 09/10/2020   MCV 90.3 09/10/2020   PLT 319.0 09/10/2020   Lab Results  Component Value Date   NA 133 (L) 12/17/2020   K 4.7 12/17/2020   CO2 25 12/17/2020   GLUCOSE 94 12/17/2020   BUN 11 12/17/2020   CREATININE 0.74 12/17/2020   BILITOT 0.7 12/17/2020   ALKPHOS 68 12/17/2020   AST 11 12/17/2020   ALT 11 12/17/2020   PROT 7.4 12/17/2020   ALBUMIN 4.1 12/17/2020   CALCIUM 9.1 12/17/2020   GFR 97.37 12/17/2020   Lab Results  Component Value Date   CHOL 228 (H) 12/17/2020   Lab Results  Component Value Date   HDL 71.30 12/17/2020   Lab Results  Component Value Date   LDLCALC 143 (H) 12/17/2020   Lab Results  Component Value Date   TRIG 70.0 12/17/2020   Lab Results  Component  Value Date   CHOLHDL 3 12/17/2020   Lab Results  Component Value Date   HGBA1C 5.9 05/09/2019       Assessment & Plan:   Problem List Items Addressed This Visit   None   Visit Diagnoses    Preventative health care    -  Primary   Relevant Orders   Cytology - PAP( [place])       No orders of the defined types were placed in this encounter.   I, Dr. Seabron Spates, DO, personally preformed the services described in this documentation.  All medical record entries made by the scribe were at my direction and in my presence.  I have reviewed the chart and discharge instructions (if applicable) and agree that the record reflects my personal performance and is accurate and complete. 01/05/2021   I,Shehryar Baig,acting as a scribe for Donato Schultz, DO.,have documented all relevant documentation on the behalf of Donato Schultz, DO,as directed by  Donato Schultz, DO while in the presence of Donato Schultz, DO.   Donato Schultz, DO

## 2021-01-07 LAB — CYTOLOGY - PAP: Diagnosis: NEGATIVE

## 2021-01-19 ENCOUNTER — Ambulatory Visit: Payer: Managed Care, Other (non HMO) | Admitting: Family Medicine

## 2021-01-19 ENCOUNTER — Other Ambulatory Visit: Payer: Self-pay

## 2021-01-19 VITALS — BP 120/88 | HR 76 | Temp 97.4°F | Resp 18 | Ht 63.8 in | Wt 131.8 lb

## 2021-01-19 DIAGNOSIS — R300 Dysuria: Secondary | ICD-10-CM

## 2021-01-19 LAB — POC URINALSYSI DIPSTICK (AUTOMATED)
Bilirubin, UA: NEGATIVE
Glucose, UA: NEGATIVE
Ketones, UA: NEGATIVE
Nitrite, UA: NEGATIVE
Protein, UA: NEGATIVE
Spec Grav, UA: <=1.005 — AB (ref 1.010–1.025)
Urobilinogen, UA: 1 E.U./dL
pH, UA: 6 (ref 5.0–8.0)

## 2021-01-19 MED ORDER — PHENAZOPYRIDINE HCL 100 MG PO TABS: 100.0000 mg | ORAL_TABLET | Freq: Three times a day (TID) | ORAL | 0 refills | Status: DC | PRN

## 2021-01-19 MED ORDER — NITROFURANTOIN MONOHYD MACRO 100 MG PO CAPS: 100.0000 mg | ORAL_CAPSULE | Freq: Two times a day (BID) | ORAL | 0 refills | Status: DC

## 2021-01-19 NOTE — Patient Instructions (Signed)
Dysuria ?Dysuria is pain or discomfort during urination. The pain or discomfort may be felt in the part of the body that drains urine from the bladder (urethra) or in the surrounding tissue of the genitals. The pain may also be felt in the groin area, lower abdomen, or lower back. ?You may have to urinate frequently or have the sudden feeling that you have to urinate (urgency). Dysuria can affect anyone, but it is more common in females. Dysuria can be caused by many different things, including: ?Urinary tract infection. ?Kidney stones or bladder stones. ?Certain STIs (sexually transmitted infections), such as chlamydia. ?Dehydration. ?Inflammation of the tissues of the vagina. ?Use of certain medicines. ?Use of certain soaps or scented products that cause irritation. ?Follow these instructions at home: ?Medicines ?Take over-the-counter and prescription medicines only as told by your health care provider. ?If you were prescribed an antibiotic medicine, take it as told by your health care provider. Do not stop taking the antibiotic even if you start to feel better. ?Eating and drinking ? ?Drink enough fluid to keep your urine pale yellow. ?Avoid caffeinated beverages, tea, and alcohol. These beverages can irritate the bladder and make dysuria worse. In males, alcohol may irritate the prostate. ?General instructions ?Watch your condition for any changes. ?Urinate often. Avoid holding urine for long periods of time. ?If you are female, you should wipe from front to back after urinating or having a bowel movement. Use each piece of toilet paper only once. ?Empty your bladder after sex. ?Keep all follow-up visits. This is important. ?If you had any tests done to find the cause of dysuria, it is up to you to get your test results. Ask your health care provider, or the department that is doing the test, when your results will be ready. ?Contact a health care provider if: ?You have a fever. ?You develop pain in your back or  sides. ?You have nausea or vomiting. ?You have blood in your urine. ?You are not urinating as often as you usually do. ?Get help right away if: ?Your pain is severe and not relieved with medicines. ?You cannot eat or drink without vomiting. ?You are confused. ?You have a rapid heartbeat while resting. ?You have shaking or chills. ?You feel extremely weak. ?Summary ?Dysuria is pain or discomfort while urinating. Many different conditions can lead to dysuria. ?If you have dysuria, you may have to urinate frequently or have the sudden feeling that you have to urinate (urgency). ?Watch your condition for any changes. Keep all follow-up visits. ?Make sure that you urinate often and drink enough fluid to keep your urine pale yellow. ?This information is not intended to replace advice given to you by your health care provider. Make sure you discuss any questions you have with your health care provider. ?Document Revised: 02/28/2020 Document Reviewed: 02/28/2020 ?Elsevier Patient Education ? 2022 [employer] Inc[employer] ? ?

## 2021-01-19 NOTE — Progress Notes (Signed)
Subjective:   By signing my name below, I, Shehryar Baig, attest that this documentation has been prepared under the direction and in the presence of Dr. Seabron Spates, DO. 01/19/2021     Patient ID: Beth Glover, female    DOB: [birth year], 45 y.o.   MRN: [id number]  Chief Complaint  Patient presents with   Dysuria    Pt states sxs started yesterday. Pt reports freq, burning, spasms.     HPI Patient is in today for a office visit. She complains of burning and frequency since yesterday. She is taking ibuprofen and AZO to manage her symptoms. She denies having any fever or back pain at this time. She also notes suprapubic tenderness.   Past Medical History:  Diagnosis Date   Abnormal Pap smear    Allergy    AMA (advanced maternal age) multigravida 35+    Bronchitis    Chicken pox    Chronic kidney disease    R ureteral reflux   GERD (gastroesophageal reflux disease)    H/O pre-eclampsia in prior pregnancy, currently pregnant    Heartburn    Hx of pyelonephritis    Hypertension    Normal pregnancy, repeat 12/31/2012   S/P cesarean section 01/02/2013   Ureteral reflux    right    Past Surgical History:  Procedure Laterality Date   BILATERAL SALPINGECTOMY Bilateral 01/02/2013   Procedure: BILATERAL SALPINGECTOMY;  Surgeon: Sherron Monday, MD;  Location: [facility];  Service: Obstetrics;  Laterality: Bilateral;   CESAREAN SECTION N/A 01/02/2013   Procedure: CESAREAN SECTION;  Surgeon: Sherron Monday, MD;  Location: [facility];  Service: Obstetrics;  Laterality: N/A;  Primary Cesarean Section Delivery Baby Boy @ 0410, Apgars    KIDNEY SURGERY     Correct R ureteral reflux, 2002   TONSILLECTOMY      Family History  Problem Relation Age of Onset   Cancer Mother    Miscarriages / [place] Mother    Breast cancer Mother    Cancer Father    Diabetes Father    Diabetes Sister    Miscarriages / [place] Sister    Depression Daughter    Heart disease Maternal Grandmother     Stroke Paternal Grandmother    Cancer Sister    Hypertension Sister    Early death Sister    Asthma Sister    Mental retardation Sister     Social History   Socioeconomic History   Marital status: Divorced    Spouse name: Not on file   Number of children: Not on file   Years of education: Not on file   Highest education level: Not on file  Occupational History   Not on file  Tobacco Use   Smoking status: Some Days    Packs/day: 0.25    Pack years: 0.00    Types: Cigarettes   Smokeless tobacco: Never   Tobacco comments:    down to 4-5 a day(12/18/17)  Substance and Sexual Activity   Alcohol use: Yes    Comment: Occas. since + UPT,  LAST TIME HAD MARIJUANA- SEPT.Marland Kitchen  LAST TIME HAD ALCOHOL- [place] [facility]   Drug use: Not Currently    Comment: None since + UPT   Sexual activity: Yes  Other Topics Concern   Not on file  Social History Narrative   Not on file   Social Determinants of Health   Financial Resource Strain: Not on file  Food Insecurity: Not on file  Transportation Needs: Not on file  Physical Activity: Not on file  Stress: Not on file  Social Connections: Not on file  Intimate Partner Violence: Not on file    Outpatient Medications Prior to Visit  Medication Sig Dispense Refill   cetirizine (ZYRTEC) 10 MG tablet Take 10 mg by mouth daily.     lisdexamfetamine (VYVANSE) 40 MG capsule Take 1 capsule (40 mg total) by mouth every morning. 30 capsule 0   lisdexamfetamine (VYVANSE) 40 MG capsule Take 1 capsule (40 mg total) by mouth every morning. 30 capsule 0   lisdexamfetamine (VYVANSE) 40 MG capsule Take 1 capsule (40 mg total) by mouth every morning. 30 capsule 0   No facility-administered medications prior to visit.    Allergies  Allergen Reactions   Amoxicillin Hives, Shortness Of Breath and Swelling    Can't take any cilllins   Penicillins Hives, Shortness Of Breath and Swelling   Corn-Containing Products    Lisinopril Other (See Comments)    Throat  itching   Amlodipine Palpitations    Review of Systems  Constitutional:  Negative for fever.  Genitourinary:  Positive for dysuria and frequency.  Musculoskeletal:  Positive for myalgias ((+)suprapubic tenderness). Negative for back pain.      Objective:    Physical Exam Constitutional:      General: She is not in acute distress.    Appearance: Normal appearance. She is not ill-appearing.  HENT:     Head: Normocephalic and atraumatic.     Right Ear: External ear normal.     Left Ear: External ear normal.  Eyes:     Extraocular Movements: Extraocular movements intact.     Pupils: Pupils are equal, round, and reactive to light.  Cardiovascular:     Rate and Rhythm: Normal rate and regular rhythm.     Pulses: Normal pulses.     Heart sounds: Normal heart sounds. No murmur heard.   No gallop.  Pulmonary:     Effort: Pulmonary effort is normal. No respiratory distress.     Breath sounds: Normal breath sounds. No wheezing, rhonchi or rales.  Skin:    General: Skin is warm and dry.  Neurological:     Mental Status: She is alert and oriented to person, place, and time.  Psychiatric:        Behavior: Behavior normal.    BP 120/88 (BP Location: Right Arm, Patient Position: Sitting, Cuff Size: Normal)   Pulse 76   Temp (!) 97.4 F (36.3 C) (Oral)   Resp 18   Ht 5' 3.8" (1.621 m)   Wt 131 lb 12.8 oz (59.8 kg)   SpO2 96%   BMI 22.77 kg/m  Wt Readings from Last 3 Encounters:  01/19/21 131 lb 12.8 oz (59.8 kg)  01/05/21 129 lb 12.8 oz (58.9 kg)  09/10/20 119 lb 12.8 oz (54.3 kg)    Diabetic Foot Exam - Simple   No data filed    Lab Results  Component Value Date   WBC 5.8 09/10/2020   HGB 14.7 09/10/2020   HCT 44.3 09/10/2020   PLT 319.0 09/10/2020   GLUCOSE 94 12/17/2020   CHOL 228 (H) 12/17/2020   TRIG 70.0 12/17/2020   HDL 71.30 12/17/2020   LDLCALC 143 (H) 12/17/2020   ALT 11 12/17/2020   AST 11 12/17/2020   NA 133 (L) 12/17/2020   K 4.7 12/17/2020   CL  100 12/17/2020   CREATININE 0.74 12/17/2020   BUN 11 12/17/2020   CO2 25 12/17/2020   TSH 1.37 09/10/2020  HGBA1C 5.9 05/09/2019    Lab Results  Component Value Date   TSH 1.37 09/10/2020   Lab Results  Component Value Date   WBC 5.8 09/10/2020   HGB 14.7 09/10/2020   HCT 44.3 09/10/2020   MCV 90.3 09/10/2020   PLT 319.0 09/10/2020   Lab Results  Component Value Date   NA 133 (L) 12/17/2020   K 4.7 12/17/2020   CO2 25 12/17/2020   GLUCOSE 94 12/17/2020   BUN 11 12/17/2020   CREATININE 0.74 12/17/2020   BILITOT 0.7 12/17/2020   ALKPHOS 68 12/17/2020   AST 11 12/17/2020   ALT 11 12/17/2020   PROT 7.4 12/17/2020   ALBUMIN 4.1 12/17/2020   CALCIUM 9.1 12/17/2020   GFR 97.37 12/17/2020   Lab Results  Component Value Date   CHOL 228 (H) 12/17/2020   Lab Results  Component Value Date   HDL 71.30 12/17/2020   Lab Results  Component Value Date   LDLCALC 143 (H) 12/17/2020   Lab Results  Component Value Date   TRIG 70.0 12/17/2020   Lab Results  Component Value Date   CHOLHDL 3 12/17/2020   Lab Results  Component Value Date   HGBA1C 5.9 05/09/2019       Assessment & Plan:   Problem List Items Addressed This Visit       Unprioritized   Dysuria - Primary    Culture pending  + leuk on ua macrobid sent in  And pyridium  F/u prn        Relevant Medications   nitrofurantoin, macrocrystal-monohydrate, (MACROBID) 100 MG capsule   phenazopyridine (PYRIDIUM) 100 MG tablet   Other Relevant Orders   POCT Urinalysis Dipstick (Automated) (Completed)   Urine Culture     Meds ordered this encounter  Medications   nitrofurantoin, macrocrystal-monohydrate, (MACROBID) 100 MG capsule    Sig: Take 1 capsule (100 mg total) by mouth 2 (two) times daily.    Dispense:  14 capsule    Refill:  0   phenazopyridine (PYRIDIUM) 100 MG tablet    Sig: Take 1 tablet (100 mg total) by mouth 3 (three) times daily as needed for pain.    Dispense:  6 tablet    Refill:   0    I, Dr. Seabron Spates, DO, personally preformed the services described in this documentation.  All medical record entries made by the scribe were at my direction and in my presence.  I have reviewed the chart and discharge instructions (if applicable) and agree that the record reflects my personal performance and is accurate and complete. 01/19/2021   I,Shehryar Baig,acting as a Neurosurgeon for [employer], DO.,have documented all relevant documentation on the behalf of Donato Schultz, DO,as directed by  Donato Schultz, DO while in the presence of Donato Schultz, DO.   Donato Schultz, DO

## 2021-01-20 ENCOUNTER — Encounter: Payer: Self-pay | Admitting: Family Medicine

## 2021-01-20 DIAGNOSIS — R300 Dysuria: Secondary | ICD-10-CM | POA: Insufficient documentation

## 2021-01-20 NOTE — Assessment & Plan Note (Signed)
Culture pending  + leuk on ua macrobid sent in  And pyridium  F/u prn

## 2021-01-21 LAB — URINE CULTURE
MICRO NUMBER:: 12031926
SPECIMEN QUALITY:: ADEQUATE

## 2021-01-21 MED ORDER — CIPROFLOXACIN HCL 250 MG PO TABS: 250.0000 mg | ORAL_TABLET | Freq: Two times a day (BID) | ORAL | 0 refills | Status: DC

## 2021-01-21 NOTE — Addendum Note (Signed)
Addended byConrad [place] D on: 01/21/2021 03:17 PM   Modules accepted: Orders

## 2021-01-27 ENCOUNTER — Other Ambulatory Visit: Payer: Self-pay | Admitting: Family Medicine

## 2021-01-27 DIAGNOSIS — F988 Other specified behavioral and emotional disorders with onset usually occurring in childhood and adolescence: Secondary | ICD-10-CM

## 2021-01-29 NOTE — Telephone Encounter (Signed)
Looks like you sent 3 rxs at her CPE 12/30/20.  Can you refuse to get out of rx que?  Patient should have at pharmacy.

## 2021-02-24 ENCOUNTER — Other Ambulatory Visit: Payer: Self-pay | Admitting: Family Medicine

## 2021-02-24 DIAGNOSIS — F988 Other specified behavioral and emotional disorders with onset usually occurring in childhood and adolescence: Secondary | ICD-10-CM

## 2021-03-11 ENCOUNTER — Ambulatory Visit: Payer: Managed Care, Other (non HMO) | Admitting: Family Medicine

## 2021-03-22 ENCOUNTER — Ambulatory Visit: Payer: Managed Care, Other (non HMO) | Admitting: Family Medicine

## 2021-03-27 ENCOUNTER — Other Ambulatory Visit: Payer: Self-pay | Admitting: Family Medicine

## 2021-03-27 DIAGNOSIS — F988 Other specified behavioral and emotional disorders with onset usually occurring in childhood and adolescence: Secondary | ICD-10-CM

## 2021-03-29 MED ORDER — LISDEXAMFETAMINE DIMESYLATE 40 MG PO CAPS: 40.0000 mg | ORAL_CAPSULE | ORAL | 0 refills | Status: DC

## 2021-03-29 NOTE — Telephone Encounter (Signed)
From the looks of this one she should have one rx left.  Please review behind me.  Requesting: vyvanse Contract: 11/25/19 UDS: 11/25/19 Last Visit: 01/05/21 Next Visit: 04/02/21 Last Refill: 12/30/20 for 3 rxs  Please Advise

## 2021-04-02 ENCOUNTER — Ambulatory Visit (INDEPENDENT_AMBULATORY_CARE_PROVIDER_SITE_OTHER): Payer: Managed Care, Other (non HMO) | Admitting: Family Medicine

## 2021-04-02 ENCOUNTER — Encounter: Payer: Self-pay | Admitting: Family Medicine

## 2021-04-02 ENCOUNTER — Other Ambulatory Visit: Payer: Self-pay

## 2021-04-02 VITALS — BP 120/82 | HR 90 | Temp 98.2°F | Resp 18 | Ht 63.8 in | Wt 128.8 lb

## 2021-04-02 DIAGNOSIS — F988 Other specified behavioral and emotional disorders with onset usually occurring in childhood and adolescence: Secondary | ICD-10-CM

## 2021-04-02 DIAGNOSIS — Z79899 Other long term (current) drug therapy: Secondary | ICD-10-CM

## 2021-04-02 NOTE — Progress Notes (Signed)
Subjective:   By signing my name below, I, Beth Glover, attest that this documentation has been prepared under the direction and in the presence of Donato Schultz, DO. 04/02/2021    Patient ID: Beth Glover, female    DOB: Jan 05, 1976, 45 y.o.   MRN: [id number]  Chief Complaint  Patient presents with   ADD   Follow-up    HPI Patient is in today for an office visit.   She is requesting a refill on 40 mg Vyvanse PO daily. She has been using the medication to manage her ADD.   She reports that for the past three weeks, she has been experiencing neck pain.The pain is exacerbated when she lifts her head too high or raises her shoulders. She has been managing the pain with Ibuprofen. She works at [employer].  She will be going on a cruise soon to the [place] and [place].  She just got her second dose of the Covid-19 vaccine.    Past Medical History:  Diagnosis Date   Abnormal Pap smear    Allergy    AMA (advanced maternal age) multigravida 35+    Bronchitis    Chicken pox    Chronic kidney disease    R ureteral reflux   GERD (gastroesophageal reflux disease)    H/O pre-eclampsia in prior pregnancy, currently pregnant    Heartburn    Hx of pyelonephritis    Hypertension    Normal pregnancy, repeat 12/31/2012   S/P cesarean section 01/02/2013   Ureteral reflux    right    Past Surgical History:  Procedure Laterality Date   BILATERAL SALPINGECTOMY Bilateral 01/02/2013   Procedure: BILATERAL SALPINGECTOMY;  Surgeon: Sherron Monday, MD;  Location: [facility];  Service: Obstetrics;  Laterality: Bilateral;   CESAREAN SECTION N/A 01/02/2013   Procedure: CESAREAN SECTION;  Surgeon: Sherron Monday, MD;  Location: [facility];  Service: Obstetrics;  Laterality: N/A;  Primary Cesarean Section Delivery Baby Boy @ 0410, Apgars    KIDNEY SURGERY     Correct R ureteral reflux, 2002   TONSILLECTOMY      Family History  Problem Relation Age of Onset   Cancer Mother    Miscarriages /  [place] Mother    Breast cancer Mother    Cancer Father    Diabetes Father    Diabetes Sister    Miscarriages / [place] Sister    Depression Daughter    Heart disease Maternal Grandmother    Stroke Paternal Grandmother    Cancer Sister    Hypertension Sister    Early death Sister    Asthma Sister    Mental retardation Sister     Social History   Socioeconomic History   Marital status: Divorced    Spouse name: Not on file   Number of children: Not on file   Years of education: Not on file   Highest education level: Not on file  Occupational History   Not on file  Tobacco Use   Smoking status: Some Days    Packs/day: 0.25    Types: Cigarettes   Smokeless tobacco: Never   Tobacco comments:    down to 4-5 a day(12/18/17)  Substance and Sexual Activity   Alcohol use: Yes    Comment: Occas. since + UPT,  LAST TIME HAD MARIJUANA- SEPT.Marland Kitchen  LAST TIME HAD ALCOHOL- [place] [facility]   Drug use: Not Currently    Comment: None since + UPT   Sexual activity: Yes  Other Topics Concern  Not on file  Social History Narrative   Not on file   Social Determinants of Health   Financial Resource Strain: Not on file  Food Insecurity: Not on file  Transportation Needs: Not on file  Physical Activity: Not on file  Stress: Not on file  Social Connections: Not on file  Intimate Partner Violence: Not on file    Outpatient Medications Prior to Visit  Medication Sig Dispense Refill   cetirizine (ZYRTEC) 10 MG tablet Take 10 mg by mouth daily.     lisdexamfetamine (VYVANSE) 40 MG capsule Take 1 capsule (40 mg total) by mouth every morning. 30 capsule 0   lisdexamfetamine (VYVANSE) 40 MG capsule Take 1 capsule (40 mg total) by mouth every morning. 30 capsule 0   lisdexamfetamine (VYVANSE) 40 MG capsule Take 1 capsule (40 mg total) by mouth every morning. 30 capsule 0   ciprofloxacin (CIPRO) 250 MG tablet Take 1 tablet (250 mg total) by mouth 2 (two) times daily. (Patient not taking: Reported  on 04/02/2021) 10 tablet 0   nitrofurantoin, macrocrystal-monohydrate, (MACROBID) 100 MG capsule Take 1 capsule (100 mg total) by mouth 2 (two) times daily. (Patient not taking: Reported on 04/02/2021) 14 capsule 0   phenazopyridine (PYRIDIUM) 100 MG tablet Take 1 tablet (100 mg total) by mouth 3 (three) times daily as needed for pain. (Patient not taking: Reported on 04/02/2021) 6 tablet 0   No facility-administered medications prior to visit.    Allergies  Allergen Reactions   Amoxicillin Hives, Shortness Of Breath and Swelling    Can't take any cilllins   Penicillins Hives, Shortness Of Breath and Swelling   Corn-Containing Products    Lisinopril Other (See Comments)    Throat itching   Amlodipine Palpitations    Review of Systems  Constitutional:  Negative for fever and malaise/fatigue.  HENT:  Negative for congestion.   Eyes:  Negative for blurred vision.  Respiratory:  Negative for shortness of breath.   Cardiovascular:  Negative for chest pain, palpitations and leg swelling.  Gastrointestinal:  Negative for abdominal pain, blood in stool and nausea.  Genitourinary:  Negative for dysuria and frequency.  Musculoskeletal:  Negative for falls.  Skin:  Negative for rash.  Neurological:  Negative for dizziness, loss of consciousness and headaches.  Endo/Heme/Allergies:  Negative for environmental allergies.  Psychiatric/Behavioral:  Negative for depression. The patient is not nervous/anxious.       Objective:    Physical Exam Vitals and nursing note reviewed.  Constitutional:      General: She is not in acute distress.    Appearance: Normal appearance. She is not ill-appearing.  HENT:     Head: Normocephalic and atraumatic.     Right Ear: External ear normal.     Left Ear: External ear normal.  Eyes:     Extraocular Movements: Extraocular movements intact.     Pupils: Pupils are equal, round, and reactive to light.  Cardiovascular:     Rate and Rhythm: Normal rate and  regular rhythm.     Pulses: Normal pulses.     Heart sounds: Normal heart sounds. No murmur heard.   No gallop.  Pulmonary:     Effort: Pulmonary effort is normal. No respiratory distress.     Breath sounds: Normal breath sounds. No wheezing, rhonchi or rales.  Abdominal:     General: Bowel sounds are normal. There is no distension.     Palpations: Abdomen is soft. There is no mass.     Tenderness:  There is no abdominal tenderness. There is no guarding or rebound.     Hernia: No hernia is present.  Musculoskeletal:     Cervical back: Normal range of motion and neck supple.  Lymphadenopathy:     Cervical: No cervical adenopathy.  Skin:    General: Skin is warm and dry.  Neurological:     Mental Status: She is alert and oriented to person, place, and time.  Psychiatric:        Behavior: Behavior normal.    BP 120/82 (BP Location: Right Arm, Patient Position: Sitting, Cuff Size: Normal)   Pulse 90   Temp 98.2 F (36.8 C) (Oral)   Resp 18   Ht 5' 3.8" (1.621 m)   Wt 128 lb 12.8 oz (58.4 kg)   SpO2 98%   BMI 22.25 kg/m  Wt Readings from Last 3 Encounters:  04/02/21 128 lb 12.8 oz (58.4 kg)  01/19/21 131 lb 12.8 oz (59.8 kg)  01/05/21 129 lb 12.8 oz (58.9 kg)    Diabetic Foot Exam - Simple   No data filed    Lab Results  Component Value Date   WBC 5.8 09/10/2020   HGB 14.7 09/10/2020   HCT 44.3 09/10/2020   PLT 319.0 09/10/2020   GLUCOSE 94 12/17/2020   CHOL 228 (H) 12/17/2020   TRIG 70.0 12/17/2020   HDL 71.30 12/17/2020   LDLCALC 143 (H) 12/17/2020   ALT 11 12/17/2020   AST 11 12/17/2020   NA 133 (L) 12/17/2020   K 4.7 12/17/2020   CL 100 12/17/2020   CREATININE 0.74 12/17/2020   BUN 11 12/17/2020   CO2 25 12/17/2020   TSH 1.37 09/10/2020   HGBA1C 5.9 05/09/2019    Lab Results  Component Value Date   TSH 1.37 09/10/2020   Lab Results  Component Value Date   WBC 5.8 09/10/2020   HGB 14.7 09/10/2020   HCT 44.3 09/10/2020   MCV 90.3 09/10/2020    PLT 319.0 09/10/2020   Lab Results  Component Value Date   NA 133 (L) 12/17/2020   K 4.7 12/17/2020   CO2 25 12/17/2020   GLUCOSE 94 12/17/2020   BUN 11 12/17/2020   CREATININE 0.74 12/17/2020   BILITOT 0.7 12/17/2020   ALKPHOS 68 12/17/2020   AST 11 12/17/2020   ALT 11 12/17/2020   PROT 7.4 12/17/2020   ALBUMIN 4.1 12/17/2020   CALCIUM 9.1 12/17/2020   GFR 97.37 12/17/2020   Lab Results  Component Value Date   CHOL 228 (H) 12/17/2020   Lab Results  Component Value Date   HDL 71.30 12/17/2020   Lab Results  Component Value Date   LDLCALC 143 (H) 12/17/2020   Lab Results  Component Value Date   TRIG 70.0 12/17/2020   Lab Results  Component Value Date   CHOLHDL 3 12/17/2020   Lab Results  Component Value Date   HGBA1C 5.9 05/09/2019       Assessment & Plan:   Problem List Items Addressed This Visit       Unprioritized   Attention deficit disorder (ADD) without hyperactivity    Uds/ contract updated  Database reviewed  con't vyvanse       Other Visit Diagnoses     Attention deficit disorder (ADD) in adult    -  Primary   Relevant Orders   Drug Monitoring Panel [id number] , Urine   High risk medication use       Relevant Orders   Drug Monitoring Panel  [id number] , Urine       No orders of the defined types were placed in this encounter.   I,Beth Glover,acting as a Neurosurgeonscribe for Fisher ScientificYvonne R Lowne Chase, DO.,have documented all relevant documentation on the behalf of Donato SchultzYvonne R Lowne Chase, DO,as directed by  Donato SchultzYvonne R Lowne Chase, DO while in the presence of Donato SchultzYvonne R Lowne Chase, DO.   I, Donato SchultzYvonne R Lowne Chase, DO., personally preformed the services described in this documentation.  All medical record entries made by the scribe were at my direction and in my presence.  I have reviewed the chart and discharge instructions (if applicable) and agree that the record reflects my personal performance and is accurate and complete. 04/02/2021

## 2021-04-02 NOTE — Patient Instructions (Signed)
Attention Deficit Hyperactivity Disorder, Adult Attention deficit hyperactivity disorder (ADHD) is a mental health disorder that starts during childhood (neurodevelopmental disorder). For many people with ADHD, the disorder continues into the adult years.Treatment can help you manage your symptoms. What are the causes? The exact cause of ADHD is not known. Most experts believe genetics andenvironmental factors contribute to ADHD. What increases the risk? The following factors may make you more likely to develop this condition: Having a family history of ADHD. Being female. Being born to a mother who smoked or drank alcohol during pregnancy. Being exposed to lead or other toxins in the womb or early in life. Being born before 37 weeks of pregnancy (prematurely) or at a low birth weight. Having experienced a brain injury. What are the signs or symptoms? Symptoms of this condition depend on the type of ADHD. The two main types are inattentive and hyperactive-impulsive. Some people may have symptoms of bothtypes. Symptoms of the inattentive type include: Difficulty paying attention. Making careless mistakes. Not following instructions. Being disorganized. Avoiding tasks that require time and attention. Losing and forgetting things. Being easily distracted. Symptoms of the hyperactive-impulsive type include: Restlessness. Talking too much. Interrupting. Difficulty with: Sitting still. Feeling motivated. Relaxing. Waiting in line or waiting for a turn. In adults, this condition may lead to certain problems, such as: Keeping jobs. Performing tasks at work. Having stable relationships. Being on time or keeping to a schedule. How is this diagnosed? This condition is diagnosed based on your current symptoms and your history of symptoms. The diagnosis can be made by a health care provider such as a primarycare provider or a mental health care specialist. Your health care provider may use a  symptom checklist or a behavior rating scale to evaluate your symptoms. He or she may also want to talk with peoplewho have observed your behaviors throughout your life. How is this treated? This condition can be treated with medicines and behavior therapy. Medicines may be the best option to reduce impulsive behaviors and improve attention. Your health care provider may recommend: Stimulant medicines. These are the most common medicines used for adult ADHD. They affect certain chemicals in the brain (neurotransmitters) and improve your ability to control your symptoms. A non-stimulant medicine for adult ADHD (atomoxetine). This medicine increases a neurotransmitter called norepinephrine. It may take weeks to months to see effects from this medicine. Counseling and behavioral management are also important for treating ADHD. Counseling is often used along with medicine. Your health care provider may suggest: Cognitive behavioral therapy (CBT). This type of therapy teaches you to replace negative thoughts and actions with positive thoughts and actions. When used as part of ADHD treatment, this therapy may also include: Coping strategies for organization, time management, impulse control, and stress reduction. Mindfulness and meditation training. Behavioral management. You may work with a coach who is specially trained to help people with ADHD manage and organize activities and function more effectively. Follow these instructions at home: Medicines  Take over-the-counter and prescription medicines only as told by your health care provider. Talk with your health care provider about the possible side effects of your medicines and how to manage them.  Lifestyle  Do not use drugs. Do not drink alcohol if: Your health care provider tells you not to drink. You are pregnant, may be pregnant, or are planning to become pregnant. If you drink alcohol: Limit how much you use to: 0-1 drink a day for  women. 0-2 drinks a day for men. Be aware   of how much alcohol is in your drink. In the [place] one drink equals one 12 oz bottle of beer (355 mL), one 5 oz glass of wine (148 mL), or one 1 oz glass of hard liquor (44 mL). Get enough sleep. Eat a healthy diet. Exercise regularly. Exercise can help to reduce stress and anxiety.  General instructions Learn as much as you can about adult ADHD, and work closely with your health care providers to find the treatments that work best for you. Follow the same schedule each day. Use reminder devices like notes, calendars, and phone apps to stay on time and organized. Keep all follow-up visits as told by your health care provider and therapist. This is important. Where to find more information A health care provider may be able to recommend resources that are available online or over the phone. You could start with: Attention Deficit Disorder Association (ADDA): www.add.org National Institute of Mental Health (NIMH): www.nimh.nih.gov Contact a health care provider if: Your symptoms continue to cause problems. You have side effects from your medicine, such as: Repeated muscle twitches, coughing, or speech outbursts. Sleep problems. Loss of appetite. Dizziness. Unusually fast heartbeat. Stomach pains. Headaches. You are struggling with anxiety, depression, or substance abuse. Get help right away if you: Have a severe reaction to a medicine. If you ever feel like you may hurt yourself or others, or have thoughts about taking your own life, get help right away. You can go to the nearest emergency department or call: Your local emergency services (911 in the [place] A suicide crisis helpline, such as the National Suicide Prevention Lifeline at 1-800-273-8255. This is open 24 hours a day. Summary ADHD is a mental health disorder that starts during childhood (neurodevelopmental disorder) and often continues into the adult years. The exact cause of ADHD  is not known. Most experts believe genetics and environmental factors contribute to ADHD. There is no cure for ADHD, but treatment with medicine, cognitive behavioral therapy, or behavioral management can help you manage your condition. This information is not intended to replace advice given to you by your health care provider. Make sure you discuss any questions you have with your healthcare provider. Document Revised: 12/10/2018 Document Reviewed: 12/10/2018 Elsevier Patient Education  2022 [employer]vier Inc.  

## 2021-04-02 NOTE — Assessment & Plan Note (Signed)
Uds/ contract updated  Database reviewed  con't vyvanse

## 2021-04-04 LAB — DRUG MONITORING PANEL 376104, URINE
Amphetamine: >15000 ng/mL — ABNORMAL HIGH (ref <250)
Amphetamines: POSITIVE ng/mL — AB (ref <500)
Barbiturates: NEGATIVE ng/mL (ref <300)
Benzodiazepines: NEGATIVE ng/mL (ref <100)
Cocaine Metabolite: NEGATIVE ng/mL (ref <150)
Desmethyltramadol: NEGATIVE ng/mL (ref <100)
Methamphetamine: NEGATIVE ng/mL (ref <250)
Opiates: NEGATIVE ng/mL (ref <100)
Oxycodone: NEGATIVE ng/mL (ref <100)
Tramadol: NEGATIVE ng/mL (ref <100)

## 2021-04-04 LAB — DM TEMPLATE

## 2021-05-03 ENCOUNTER — Other Ambulatory Visit: Payer: Self-pay | Admitting: Family Medicine

## 2021-05-03 DIAGNOSIS — F988 Other specified behavioral and emotional disorders with onset usually occurring in childhood and adolescence: Secondary | ICD-10-CM

## 2021-05-03 NOTE — Telephone Encounter (Signed)
Requesting: Vyvanse 40mg  Contract: 04/02/2021 UDS: 04/02/2021 Last Visit: 04/02/2021 Next Visit: None Last Refill: 03/29/2021 #30 and 0RF  Please Advise

## 2021-05-04 MED ORDER — LISDEXAMFETAMINE DIMESYLATE 40 MG PO CAPS: 40.0000 mg | ORAL_CAPSULE | ORAL | 0 refills | Status: DC

## 2021-05-11 LAB — HM MAMMOGRAPHY

## 2021-06-02 ENCOUNTER — Other Ambulatory Visit: Payer: Self-pay | Admitting: Family Medicine

## 2021-06-02 DIAGNOSIS — F988 Other specified behavioral and emotional disorders with onset usually occurring in childhood and adolescence: Secondary | ICD-10-CM

## 2021-06-02 NOTE — Telephone Encounter (Signed)
Requesting: vyvanse Contract:  UDS: 04/02/21 Last Visit: 04/02/21  Next Visit: none Last Refill: 05/04/21  Please Advise

## 2021-06-03 MED ORDER — LISDEXAMFETAMINE DIMESYLATE 40 MG PO CAPS: 40.0000 mg | ORAL_CAPSULE | ORAL | 0 refills | Status: DC

## 2021-07-02 ENCOUNTER — Other Ambulatory Visit: Payer: Self-pay | Admitting: Family Medicine

## 2021-07-02 DIAGNOSIS — F988 Other specified behavioral and emotional disorders with onset usually occurring in childhood and adolescence: Secondary | ICD-10-CM

## 2021-07-05 MED ORDER — LISDEXAMFETAMINE DIMESYLATE 40 MG PO CAPS
40.0000 mg | ORAL_CAPSULE | ORAL | 0 refills | Status: DC
Start: 2021-07-05 — End: 2021-08-03

## 2021-07-05 NOTE — Telephone Encounter (Signed)
Requesting: Vyvanse 40mg   Contract: 04/02/2021 UDS: 04/02/2021 Last Visit: 04/02/2021 Next Visit: None Last Refill: 06/03/2021 #30 and 0RF  Please Advise

## 2021-08-03 ENCOUNTER — Other Ambulatory Visit: Payer: Self-pay | Admitting: Family Medicine

## 2021-08-03 DIAGNOSIS — F988 Other specified behavioral and emotional disorders with onset usually occurring in childhood and adolescence: Secondary | ICD-10-CM

## 2021-08-03 MED ORDER — LISDEXAMFETAMINE DIMESYLATE 40 MG PO CAPS: 40.0000 mg | ORAL_CAPSULE | ORAL | 0 refills | Status: DC

## 2021-08-03 NOTE — Telephone Encounter (Signed)
Requesting: Vyvanse 40mg  Contract:  04/02/2021 UDS: 04/02/2021 Last Visit: 04/02/2021 Next Visit: None Last Refill: 07/05/2021 #30 and 0RF  Please Advise

## 2021-09-03 ENCOUNTER — Other Ambulatory Visit: Payer: Self-pay | Admitting: Family Medicine

## 2021-09-03 DIAGNOSIS — F988 Other specified behavioral and emotional disorders with onset usually occurring in childhood and adolescence: Secondary | ICD-10-CM

## 2021-09-03 MED ORDER — LISDEXAMFETAMINE DIMESYLATE 40 MG PO CAPS: 40.0000 mg | ORAL_CAPSULE | ORAL | 0 refills | Status: DC

## 2021-09-30 ENCOUNTER — Other Ambulatory Visit: Payer: Self-pay | Admitting: Family Medicine

## 2021-09-30 DIAGNOSIS — F988 Other specified behavioral and emotional disorders with onset usually occurring in childhood and adolescence: Secondary | ICD-10-CM

## 2021-10-01 MED ORDER — LISDEXAMFETAMINE DIMESYLATE 40 MG PO CAPS: 40.0000 mg | ORAL_CAPSULE | ORAL | 0 refills | Status: DC

## 2021-10-01 NOTE — Telephone Encounter (Signed)
Requesting: Vyvanse 40mg   ?Contract:04/02/2021 ?UDS:04/02/2021 ?Last Visit: 04/02/2021 ?Next Visit: None ?Last Refill: 09/03/2021 #30 and 0RF ? ?Please Advise ? ?

## 2021-11-01 ENCOUNTER — Other Ambulatory Visit: Payer: Self-pay | Admitting: Family Medicine

## 2021-11-01 DIAGNOSIS — F988 Other specified behavioral and emotional disorders with onset usually occurring in childhood and adolescence: Secondary | ICD-10-CM

## 2021-11-02 ENCOUNTER — Encounter: Payer: Self-pay | Admitting: Family Medicine

## 2021-11-02 DIAGNOSIS — F988 Other specified behavioral and emotional disorders with onset usually occurring in childhood and adolescence: Secondary | ICD-10-CM

## 2021-11-02 NOTE — Telephone Encounter (Signed)
LVM for pt call back for appt  ?Can your refuse plz thx ?

## 2021-11-02 NOTE — Telephone Encounter (Signed)
Requesting: Vyvanse  ?Contract: 04/02/2021 ?UDS: 04/02/2021 ?Last OV: 04/02/2021 ?Next OV:  11/22/2021 ?Last Refill: 10/01/2021, #30--0 RF ?Database: ? ? ?Please advise   ?

## 2021-11-02 NOTE — Telephone Encounter (Signed)
LVM for Pt call back for appt  ?

## 2021-11-04 ENCOUNTER — Other Ambulatory Visit: Payer: Self-pay | Admitting: Family Medicine

## 2021-11-04 DIAGNOSIS — F988 Other specified behavioral and emotional disorders with onset usually occurring in childhood and adolescence: Secondary | ICD-10-CM

## 2021-11-04 MED ORDER — LISDEXAMFETAMINE DIMESYLATE 40 MG PO CAPS: 40.0000 mg | ORAL_CAPSULE | ORAL | 0 refills | Status: DC

## 2021-11-22 ENCOUNTER — Encounter: Payer: Self-pay | Admitting: Medical

## 2021-11-22 ENCOUNTER — Ambulatory Visit (INDEPENDENT_AMBULATORY_CARE_PROVIDER_SITE_OTHER): Payer: Managed Care, Other (non HMO) | Admitting: Medical

## 2021-11-22 VITALS — BP 120/78 | HR 96 | Resp 18 | Ht 63.8 in | Wt 137.2 lb

## 2021-11-22 DIAGNOSIS — N926 Irregular menstruation, unspecified: Secondary | ICD-10-CM | POA: Diagnosis not present

## 2021-11-22 DIAGNOSIS — F988 Other specified behavioral and emotional disorders with onset usually occurring in childhood and adolescence: Secondary | ICD-10-CM

## 2021-11-22 DIAGNOSIS — R232 Flushing: Secondary | ICD-10-CM | POA: Diagnosis not present

## 2021-11-22 DIAGNOSIS — Z0000 Encounter for general adult medical examination without abnormal findings: Secondary | ICD-10-CM

## 2021-11-22 LAB — LIPID PANEL
Cholesterol: 213 mg/dL — ABNORMAL HIGH (ref 0–200)
HDL: 66.8 mg/dL (ref >39.00)
LDL Cholesterol: 132 mg/dL — ABNORMAL HIGH (ref 0–99)
NonHDL: 146.5
Total CHOL/HDL Ratio: 3
Triglycerides: 73 mg/dL (ref 0.0–149.0)
VLDL: 14.6 mg/dL (ref 0.0–40.0)

## 2021-11-22 LAB — CBC WITH DIFFERENTIAL/PLATELET
Basophils Absolute: 0 10*3/uL (ref 0.0–0.1)
Basophils Relative: 0.8 % (ref 0.0–3.0)
Eosinophils Absolute: 0.3 10*3/uL (ref 0.0–0.7)
Eosinophils Relative: 5.6 % — ABNORMAL HIGH (ref 0.0–5.0)
HCT: 39.1 % (ref 36.0–46.0)
Hemoglobin: 13.3 g/dL (ref 12.0–15.0)
Lymphocytes Relative: 29.7 % (ref 12.0–46.0)
Lymphs Abs: 1.8 10*3/uL (ref 0.7–4.0)
MCHC: 33.9 g/dL (ref 30.0–36.0)
MCV: 89.4 fl (ref 78.0–100.0)
Monocytes Absolute: 0.4 10*3/uL (ref 0.1–1.0)
Monocytes Relative: 6.5 % (ref 3.0–12.0)
Neutro Abs: 3.5 10*3/uL (ref 1.4–7.7)
Neutrophils Relative %: 57.4 % (ref 43.0–77.0)
Platelets: 306 10*3/uL (ref 150.0–400.0)
RBC: 4.38 Mil/uL (ref 3.87–5.11)
RDW: 14.5 % (ref 11.5–15.5)
WBC: 6.1 10*3/uL (ref 4.0–10.5)

## 2021-11-22 LAB — COMPREHENSIVE METABOLIC PANEL
ALT: 11 U/L (ref 0–35)
AST: 12 U/L (ref 0–37)
Albumin: 4.1 g/dL (ref 3.5–5.2)
Alkaline Phosphatase: 55 U/L (ref 39–117)
BUN: 14 mg/dL (ref 6–23)
CO2: 26 mEq/L (ref 19–32)
Calcium: 9.4 mg/dL (ref 8.4–10.5)
Chloride: 103 mEq/L (ref 96–112)
Creatinine, Ser: 0.7 mg/dL (ref 0.40–1.20)
GFR: 103.41 mL/min (ref >60.00)
Glucose, Bld: 105 mg/dL — ABNORMAL HIGH (ref 70–99)
Potassium: 4.7 mEq/L (ref 3.5–5.1)
Sodium: 137 mEq/L (ref 135–145)
Total Bilirubin: 0.5 mg/dL (ref 0.2–1.2)
Total Protein: 7 g/dL (ref 6.0–8.3)

## 2021-11-22 LAB — FOLLICLE STIMULATING HORMONE: FSH: 6.2 m[IU]/mL

## 2021-11-22 MED ORDER — NICOTINE 7 MG/24HR TD PT24: 7.0000 mg | MEDICATED_PATCH | Freq: Every day | TRANSDERMAL | 0 refills | Status: DC
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
## 2021-11-22 NOTE — Patient Instructions (Addendum)
For you wellness exam today I have ordered cbc, cmp and  lipid panel. ? ?Vaccine up to date.  Note covid vaccine  one time and had covid infection once. ? ?Recommend exercise and healthy diet. ? ?We will let you know lab results as they come in. ? ?Follow up date appointment will be determined after lab review.   ? ?Deferring colonoscopy referral per your request. Let us know when you want to be referred. ? ?For smoking cessation rx nicotine patches.  ? ?If stress/anxiety from recent events too much could rx brief 3 day course or so anxiety meds. ? ?For hot flashes and irregular menses will get fsh ? ?Rx smoking cessation rx nicotine patches. ? ? ?ADD- she is up to date on contract and UDS. Doing well on vyvanse. ? ? ?Preventive Care 72-39 Years Old, Female ?Preventive care refers to lifestyle choices and visits with your health care provider that can promote health and wellness. Preventive care visits are also called wellness exams. ?What can I expect for my preventive care visit? ?Counseling ?Your health care provider may ask you questions about your: ?Medical history, including: ?Past medical problems. ?Family medical history. ?Pregnancy history. ?Current health, including: ?Menstrual cycle. ?Method of birth control. ?Emotional well-being. ?Home life and relationship well-being. ?Sexual activity and sexual health. ?Lifestyle, including: ?Alcohol, nicotine or tobacco, and drug use. ?Access to firearms. ?Diet, exercise, and sleep habits. ?Work and work Statistician. ?Sunscreen use. ?Safety issues such as seatbelt and bike helmet use. ?Physical exam ?Your health care provider will check your: ?Height and weight. These may be used to calculate your BMI (body mass index). BMI is a measurement that tells if you are at a healthy weight. ?Waist circumference. This measures the distance around your waistline. This measurement also tells if you are at a healthy weight and may help predict your risk of certain diseases, such  as type 2 diabetes and high blood pressure. ?Heart rate and blood pressure. ?Body temperature. ?Skin for abnormal spots. ?What immunizations do I need? ? ?Vaccines are usually given at various ages, according to a schedule. Your health care provider will recommend vaccines for you based on your age, medical history, and lifestyle or other factors, such as travel or where you work. ?What tests do I need? ?Screening ?Your health care provider may recommend screening tests for certain conditions. This may include: ?Lipid and cholesterol levels. ?Diabetes screening. This is done by checking your blood sugar (glucose) after you have not eaten for a while (fasting). ?Pelvic exam and Pap test. ?Hepatitis B test. ?Hepatitis C test. ?HIV (human immunodeficiency virus) test. ?STI (sexually transmitted infection) testing, if you are at risk. ?Lung cancer screening. ?Colorectal cancer screening. ?Mammogram. Talk with your health care provider about when you should start having regular mammograms. This may depend on whether you have a family history of breast cancer. ?BRCA-related cancer screening. This may be done if you have a family history of breast, ovarian, tubal, or peritoneal cancers. ?Bone density scan. This is done to screen for osteoporosis. ?Talk with your health care provider about your test results, treatment options, and if necessary, the need for more tests. ?Follow these instructions at home: ?Eating and drinking ? ?Eat a diet that includes fresh fruits and vegetables, whole grains, lean protein, and low-fat dairy products. ?Take vitamin and mineral supplements as recommended by your health care provider. ?Do not drink alcohol if: ?Your health care provider tells you not to drink. ?You are pregnant, may be pregnant, or are  planning to become pregnant. ?If you drink alcohol: ?Limit how much you have to 0-1 drink a day. ?Know how much alcohol is in your drink. In the [place] one drink equals one 12 oz bottle of beer  (355 mL), one 5 oz glass of wine (148 mL), or one 1? oz glass of hard liquor (44 mL). ?Lifestyle ?Brush your teeth every morning and night with fluoride toothpaste. Floss one time each day. ?Exercise for at least 30 minutes 5 or more days each week. ?Do not use any products that contain nicotine or tobacco. These products include cigarettes, chewing tobacco, and vaping devices, such as e-cigarettes. If you need help quitting, ask your health care provider. ?Do not use drugs. ?If you are sexually active, practice safe sex. Use a condom or other form of protection to prevent STIs. ?If you do not wish to become pregnant, use a form of birth control. If you plan to become pregnant, see your health care provider for a prepregnancy visit. ?Take aspirin only as told by your health care provider. Make sure that you understand how much to take and what form to take. Work with your health care provider to find out whether it is safe and beneficial for you to take aspirin daily. ?Find healthy ways to manage stress, such as: ?Meditation, yoga, or listening to music. ?Journaling. ?Talking to a trusted person. ?Spending time with friends and family. ?Minimize exposure to UV radiation to reduce your risk of skin cancer. ?Safety ?Always wear your seat belt while driving or riding in a vehicle. ?Do not drive: ?If you have been drinking alcohol. Do not ride with someone who has been drinking. ?When you are tired or distracted. ?While texting. ?If you have been using any mind-altering substances or drugs. ?Wear a helmet and other protective equipment during sports activities. ?If you have firearms in your house, make sure you follow all gun safety procedures. ?Seek help if you have been physically or sexually abused. ?What's next? ?Visit your health care provider once a year for an annual wellness visit. ?Ask your health care provider how often you should have your eyes and teeth checked. ?Stay up to date on all vaccines. ?This  information is not intended to replace advice given to you by your health care provider. Make sure you discuss any questions you have with your health care provider. ?Document Revised: 01/13/2021 Document Reviewed: 01/13/2021 ?Elsevier Patient Education ? [employer]. ? ?

## 2021-12-03 ENCOUNTER — Other Ambulatory Visit: Payer: Self-pay | Admitting: Family Medicine

## 2021-12-03 DIAGNOSIS — F988 Other specified behavioral and emotional disorders with onset usually occurring in childhood and adolescence: Secondary | ICD-10-CM

## 2021-12-06 MED ORDER — LISDEXAMFETAMINE DIMESYLATE 40 MG PO CAPS: 40.0000 mg | ORAL_CAPSULE | ORAL | 0 refills | Status: DC

## 2021-12-06 NOTE — Telephone Encounter (Signed)
Requesting Vyvanse 40mg   ?Contract: 04/02/21 ?UDS: 04/02/21 ?Last Visit: 11/22/21 ?Next Visit: None  ?Last Refill: 11/04/21 #30 and 0RF  ? ?Please Advise ? ?

## 2022-01-04 ENCOUNTER — Other Ambulatory Visit: Payer: Self-pay | Admitting: Family Medicine

## 2022-01-04 DIAGNOSIS — F988 Other specified behavioral and emotional disorders with onset usually occurring in childhood and adolescence: Secondary | ICD-10-CM

## 2022-01-04 MED ORDER — LISDEXAMFETAMINE DIMESYLATE 40 MG PO CAPS: 40.0000 mg | ORAL_CAPSULE | ORAL | 0 refills | Status: DC

## 2022-01-04 NOTE — Telephone Encounter (Signed)
Requesting: Vyvanse 40mg  Contract: 04/02/21 UDS: 04/02/21 Last Visit: 11/22/21 Next Visit: none Last Refill: 12/06/21   Please Advise

## 2022-01-31 ENCOUNTER — Other Ambulatory Visit: Payer: Self-pay | Admitting: Family Medicine

## 2022-01-31 DIAGNOSIS — F988 Other specified behavioral and emotional disorders with onset usually occurring in childhood and adolescence: Secondary | ICD-10-CM

## 2022-01-31 MED ORDER — LISDEXAMFETAMINE DIMESYLATE 40 MG PO CAPS: 40.0000 mg | ORAL_CAPSULE | ORAL | 0 refills | Status: DC

## 2022-01-31 NOTE — Telephone Encounter (Signed)
Requesting: Vyvanse 40mg   Contract: 04/02/21 UDS: 04/02/21 Last Visit: 11/22/21 Next Visit: None Last Refill: 01/04/22 #30 and 0RF  Please Advise

## 2022-03-01 ENCOUNTER — Other Ambulatory Visit: Payer: Self-pay | Admitting: Family Medicine

## 2022-03-01 DIAGNOSIS — F988 Other specified behavioral and emotional disorders with onset usually occurring in childhood and adolescence: Secondary | ICD-10-CM

## 2022-03-01 MED ORDER — LISDEXAMFETAMINE DIMESYLATE 40 MG PO CAPS: 40.0000 mg | ORAL_CAPSULE | ORAL | 0 refills | Status: DC

## 2022-03-01 NOTE — Telephone Encounter (Signed)
Requesting: Vyvanse 40mg   Contract: 04/02/21 UDS: 04/02/21 Last Visit: 11/22/21 w/ 11/24/21 Next Visit: None Last Refill: 01/31/22 #30 and 0RF  Please Advise

## 2022-04-04 ENCOUNTER — Other Ambulatory Visit: Payer: Self-pay | Admitting: Family Medicine

## 2022-04-04 DIAGNOSIS — F988 Other specified behavioral and emotional disorders with onset usually occurring in childhood and adolescence: Secondary | ICD-10-CM

## 2022-04-05 ENCOUNTER — Other Ambulatory Visit: Payer: Self-pay | Admitting: Family Medicine

## 2022-04-05 DIAGNOSIS — F988 Other specified behavioral and emotional disorders with onset usually occurring in childhood and adolescence: Secondary | ICD-10-CM

## 2022-04-05 MED ORDER — LISDEXAMFETAMINE DIMESYLATE 40 MG PO CAPS: 40.0000 mg | ORAL_CAPSULE | ORAL | 0 refills | Status: DC

## 2022-04-05 NOTE — Telephone Encounter (Signed)
Requesting: Vyvanse 40mg   Contract: 04/02/21 UDS: 04/02/21 Last Visit: 11/22/21 Next Visit: None Last Refill: 03/01/22 #30 and 0RF  Please Advise

## 2022-05-04 ENCOUNTER — Other Ambulatory Visit: Payer: Self-pay | Admitting: Family Medicine

## 2022-05-04 DIAGNOSIS — F988 Other specified behavioral and emotional disorders with onset usually occurring in childhood and adolescence: Secondary | ICD-10-CM

## 2022-05-04 NOTE — Telephone Encounter (Signed)
Requesting: Vyvanse 40mg   Contract: 04/02/21 UDS: 04/02/21 Last Visit: 11/22/21 w/ Percell Miller Next Visit: None Last Refill: 04/05/22 #30 and 0RF  Please Advise

## 2022-05-05 ENCOUNTER — Other Ambulatory Visit: Payer: Self-pay | Admitting: Family Medicine

## 2022-05-05 DIAGNOSIS — F988 Other specified behavioral and emotional disorders with onset usually occurring in childhood and adolescence: Secondary | ICD-10-CM

## 2022-05-05 MED ORDER — LISDEXAMFETAMINE DIMESYLATE 40 MG PO CAPS: 40.0000 mg | ORAL_CAPSULE | ORAL | 0 refills | Status: DC

## 2022-05-19 LAB — HM MAMMOGRAPHY

## 2022-05-24 ENCOUNTER — Encounter: Payer: Self-pay | Admitting: *Deleted

## 2022-06-08 ENCOUNTER — Other Ambulatory Visit: Payer: Self-pay | Admitting: Family Medicine

## 2022-06-08 DIAGNOSIS — F988 Other specified behavioral and emotional disorders with onset usually occurring in childhood and adolescence: Secondary | ICD-10-CM

## 2022-06-08 NOTE — Telephone Encounter (Signed)
Medication:   lisdexamfetamine (VYVANSE) 40 MG capsule [[id number]]   Has the patient contacted their pharmacy? No. (If no, request that the patient contact the pharmacy for the refill.) (If yes, when and what did the pharmacy advise?)  Preferred Pharmacy (with phone number or street name):   [phone] [place] [place] - [address] [place], [place] [address] Phone: [phone]  Fax: [phone]    Agent: Please be advised that RX refills may take up to 3 business days. We ask that you follow-up with your pharmacy.

## 2022-06-08 NOTE — Telephone Encounter (Signed)
Requesting: Vyvanse Contract: 04/02/2021 UDS: 04/02/2021 Last OV: 11/22/2021 w/ S[place]ier Next OV: N/A Last Refill: 05/05/2022, #30--0 RF Database:   Please advise

## 2022-06-09 MED ORDER — LISDEXAMFETAMINE DIMESYLATE 40 MG PO CAPS: 40.0000 mg | ORAL_CAPSULE | ORAL | 0 refills | Status: DC

## 2022-06-14 ENCOUNTER — Telehealth: Payer: Self-pay | Admitting: Family Medicine

## 2022-06-14 DIAGNOSIS — F988 Other specified behavioral and emotional disorders with onset usually occurring in childhood and adolescence: Secondary | ICD-10-CM

## 2022-06-14 NOTE — Telephone Encounter (Signed)
P called stating that her regular pharmacy is out of vyvanse and she needs her Rx rerouted to the following pharmacy:  [facility] [address], [place], [place] [address] P: [phone]

## 2022-06-15 ENCOUNTER — Other Ambulatory Visit: Payer: Self-pay

## 2022-06-15 DIAGNOSIS — F988 Other specified behavioral and emotional disorders with onset usually occurring in childhood and adolescence: Secondary | ICD-10-CM

## 2022-06-15 NOTE — Telephone Encounter (Signed)
Med needs to be sent to a new pharmacy. Out of stock at previous pharmacy

## 2022-06-16 ENCOUNTER — Other Ambulatory Visit: Payer: Self-pay | Admitting: Family

## 2022-06-16 DIAGNOSIS — F988 Other specified behavioral and emotional disorders with onset usually occurring in childhood and adolescence: Secondary | ICD-10-CM

## 2022-06-16 MED ORDER — LISDEXAMFETAMINE DIMESYLATE 40 MG PO CAPS: 40.0000 mg | ORAL_CAPSULE | ORAL | 0 refills | Status: DC

## 2022-07-15 ENCOUNTER — Other Ambulatory Visit: Payer: Self-pay | Admitting: Family Medicine

## 2022-07-15 DIAGNOSIS — F988 Other specified behavioral and emotional disorders with onset usually occurring in childhood and adolescence: Secondary | ICD-10-CM

## 2022-07-15 MED ORDER — LISDEXAMFETAMINE DIMESYLATE 40 MG PO CAPS: 40.0000 mg | ORAL_CAPSULE | ORAL | 0 refills | Status: DC

## 2022-07-15 NOTE — Telephone Encounter (Signed)
Requesting: Vyvanse Contract: 04/02/2020 UDS: 04/02/2020 Last OV: 11/22/2021 Next OV: N/A Last Refill: 06/16/2022, #30--0 RF Database:   Please advise

## 2022-07-15 NOTE — Telephone Encounter (Signed)
Pt needs Rx sent to the following pharmacy due to shortage and pharmacy requested diagnosis with Rx to fill:  Prescription Request  07/15/2022  Is this a "Controlled Substance" medicine? Yes  LOV: Visit date not found  What is the name of the medication or equipment?   lisdexamfetamine (VYVANSE) 40 MG capsule [[id number]]   Have you contacted your pharmacy to request a refill? No   Which pharmacy would you like this sent to?   [facility] [address], [place], [place] [address] P: [phone]  Patient notified that their request is being sent to the clinical staff for review and that they should receive a response within 2 business days.   Please advise at Mobile [phone] (mobile)

## 2022-07-18 ENCOUNTER — Other Ambulatory Visit: Payer: Self-pay | Admitting: Family Medicine

## 2022-07-18 ENCOUNTER — Telehealth: Payer: Self-pay | Admitting: *Deleted

## 2022-07-18 DIAGNOSIS — F988 Other specified behavioral and emotional disorders with onset usually occurring in childhood and adolescence: Secondary | ICD-10-CM

## 2022-07-18 MED ORDER — LISDEXAMFETAMINE DIMESYLATE 40 MG PO CAPS: 40.0000 mg | ORAL_CAPSULE | ORAL | 0 refills | Status: DC

## 2022-07-18 NOTE — Telephone Encounter (Signed)
Pt called stating that her vyvanse needed to be routed to the following pharmacy due to shortage along with a copy of the diagnosis:  [facility] [address] [place], [place] [address] P: [phone]

## 2022-07-18 NOTE — Telephone Encounter (Signed)
Who Is Calling Patient / Member / Family / Caregiver Call Type Triage / Clinical Relationship To Patient Self Return Phone Number [phone] (Primary) Chief Complaint Prescription Refill or Medication Request (non symptomatic) Reason for Call Symptomatic / Request for Health Information Initial Comment Caller states that she called earlier for a prescription refill and she needs to have it sent to a different pharmacy as her normal pharmacy does not have the medication in stock. The medication is vivance. She did find a pharmacy that has the medication in stock. Translation No Nurse Assessment Nurse: Maisie Fus, RN, Cheri Date/Time (Eastern Time): 07/15/2022 7:00:53 PM Confirm and document reason for call. If symptomatic, describe symptoms. ---Caller states her prescription for Vyvanse is out of stock and she would like to have it transferred to another pharmacy. States no new or worsening symptoms at this time. Does the patient have any new or worsening symptoms? ---No Please document clinical information provided and list any resource used. ---Recommended patient contact the office during regular office hours for controlled substance prescription requests.

## 2022-07-18 NOTE — Telephone Encounter (Signed)
Med needs to be resent to different pharmacy due to shortage.

## 2022-07-18 NOTE — Telephone Encounter (Signed)
Pt called. LVM. Need new pharmacy for Vyvanse

## 2022-07-18 NOTE — Addendum Note (Signed)
Addended by: Roxanne Gates on: 07/18/2022 03:29 PM   Modules accepted: Orders

## 2022-08-30 ENCOUNTER — Other Ambulatory Visit: Payer: Self-pay | Admitting: Family Medicine

## 2022-08-30 DIAGNOSIS — F988 Other specified behavioral and emotional disorders with onset usually occurring in childhood and adolescence: Secondary | ICD-10-CM

## 2022-08-31 MED ORDER — LISDEXAMFETAMINE DIMESYLATE 40 MG PO CAPS: 40.0000 mg | ORAL_CAPSULE | ORAL | 0 refills | Status: DC

## 2022-08-31 NOTE — Telephone Encounter (Signed)
Requesting:vyvanse 40 mg Contract:04/02/21 UDS:04/02/21 Last Visit:11/22/21 Next Visit:unknown Last Refill:07/18/22  Please Advise

## 2022-10-04 ENCOUNTER — Other Ambulatory Visit: Payer: Self-pay | Admitting: Family Medicine

## 2022-10-04 DIAGNOSIS — F988 Other specified behavioral and emotional disorders with onset usually occurring in childhood and adolescence: Secondary | ICD-10-CM

## 2022-10-04 NOTE — Telephone Encounter (Signed)
Requesting: Vyvanse '40mg'$   Contract: 09/10/20 UDS: 04/02/21 Last Visit: 11/22/21 w/ Percell Miller  Next Visit: None Last Refill: 08/31/22 #30 and 0RF  Please Advise

## 2022-10-05 MED ORDER — LISDEXAMFETAMINE DIMESYLATE 40 MG PO CAPS: 40.0000 mg | ORAL_CAPSULE | ORAL | 0 refills | Status: DC

## 2022-11-09 ENCOUNTER — Other Ambulatory Visit: Payer: Self-pay | Admitting: Family Medicine

## 2022-11-09 DIAGNOSIS — F988 Other specified behavioral and emotional disorders with onset usually occurring in childhood and adolescence: Secondary | ICD-10-CM

## 2022-11-09 NOTE — Telephone Encounter (Signed)
Patient requesting her refill for Vyvanse to be sent to [facility] located at [facility]. Pharmacy [phone].

## 2022-11-09 NOTE — Addendum Note (Signed)
Addended by: Roxanne Gates on: 11/09/2022 03:13 PM   Modules accepted: Orders

## 2022-11-09 NOTE — Telephone Encounter (Signed)
Requesting: Vyvanse  Contract: 04/02/2021 UDS: 04/02/2021 Last OV: 11/22/2021 Next OV: 11/24/22 Last Refill: 10/05/2022, #30--0 RF Database:   Please advise

## 2022-11-10 ENCOUNTER — Other Ambulatory Visit: Payer: Self-pay | Admitting: Family Medicine

## 2022-11-10 DIAGNOSIS — F988 Other specified behavioral and emotional disorders with onset usually occurring in childhood and adolescence: Secondary | ICD-10-CM

## 2022-11-10 MED ORDER — LISDEXAMFETAMINE DIMESYLATE 40 MG PO CAPS: 40.0000 mg | ORAL_CAPSULE | ORAL | 0 refills | Status: DC

## 2022-11-24 ENCOUNTER — Encounter: Payer: Self-pay | Admitting: Family Medicine

## 2022-11-24 ENCOUNTER — Ambulatory Visit (INDEPENDENT_AMBULATORY_CARE_PROVIDER_SITE_OTHER): Payer: Managed Care, Other (non HMO) | Admitting: Family Medicine

## 2022-11-24 VITALS — BP 148/92 | HR 80 | Temp 98.2°F | Resp 16 | Ht 62.5 in | Wt 141.0 lb

## 2022-11-24 DIAGNOSIS — Z1211 Encounter for screening for malignant neoplasm of colon: Secondary | ICD-10-CM | POA: Diagnosis not present

## 2022-11-24 DIAGNOSIS — Z0000 Encounter for general adult medical examination without abnormal findings: Secondary | ICD-10-CM | POA: Diagnosis not present

## 2022-11-24 DIAGNOSIS — F988 Other specified behavioral and emotional disorders with onset usually occurring in childhood and adolescence: Secondary | ICD-10-CM

## 2022-11-24 DIAGNOSIS — E782 Mixed hyperlipidemia: Secondary | ICD-10-CM | POA: Diagnosis not present

## 2022-11-24 NOTE — Progress Notes (Signed)
Subjective:   By signing my name below, I, Shehryar Baig, attest that this documentation has been prepared under the direction and in the presence of Donato Schultz, DO. 11/24/2022   Patient ID: Beth Glover, female    DOB: 11/19/1975, 47 y.o.   MRN: [id number]  Chief Complaint  Patient presents with   Annual Exam    HPI Patient is in today for a comprehensive physical exam.  She reports experiencing occasional hot flashes.  She continues following up with her GYN specialist.  She denies fever, new moles, congestion, sinus pain, sore throat, chest pain, palpitations, cough, shortness of breath, wheezing, nausea, vomiting, abdominal pain, diarrhea, constipation, dysuria, frequency, hematuria, new muscle pain, new joint pain, or headaches at this time.  She has tubal litigation for birth control. She continues smoking 3-5 cigarettes daily. She has been smoking since she was 47 years old. She notes having periods of quitting and resuming smoking.  She is not interested in receiving the tetanus vaccine.  She is not exercising regularly at this time.  Mammogram was last completed 05/19/2022. Results are normal. Repeat in 1 year.  Never completed a colonoscopy.  She is UTD on dental care and vision care.    Past Medical History:  Diagnosis Date   Abnormal Pap smear    Allergy    AMA (advanced maternal age) multigravida 35+    Bronchitis    Chicken pox    Chronic kidney disease    R ureteral reflux   GERD (gastroesophageal reflux disease)    H/O pre-eclampsia in prior pregnancy, currently pregnant    Heartburn    Hx of pyelonephritis    Hypertension    Normal pregnancy, repeat 12/31/2012   S/P cesarean section 01/02/2013   Ureteral reflux    right    Past Surgical History:  Procedure Laterality Date   BILATERAL SALPINGECTOMY Bilateral 01/02/2013   Procedure: BILATERAL SALPINGECTOMY;  Surgeon: Sherron Monday, MD;  Location: [facility];  Service: Obstetrics;  Laterality:  Bilateral;   CESAREAN SECTION N/A 01/02/2013   Procedure: CESAREAN SECTION;  Surgeon: Sherron Monday, MD;  Location: [facility];  Service: Obstetrics;  Laterality: N/A;  Primary Cesarean Section Delivery Baby Boy @ 0410, Apgars    KIDNEY SURGERY     Correct R ureteral reflux, 2002   TONSILLECTOMY      Family History  Problem Relation Age of Onset   Cancer Mother    Miscarriages / [place] Mother    Breast cancer Mother    Cancer Father    Diabetes Father    Diabetes Sister    Miscarriages / [place] Sister    Depression Daughter    Heart disease Maternal Grandmother    Stroke Paternal Grandmother    Cancer Sister    Hypertension Sister    Early death Sister    Asthma Sister    Mental retardation Sister     Social History   Socioeconomic History   Marital status: Significant Other    Spouse name: Not on file   Number of children: Not on file   Years of education: Not on file   Highest education level: Not on file  Occupational History   Not on file  Tobacco Use   Smoking status: Some Days    Packs/day: 0.25    Years: 34.00    Additional pack years: 0.00    Total pack years: 8.50    Types: Cigarettes   Smokeless tobacco: Never   Tobacco comments:  down to 4-5 a day(12/18/17)  Substance and Sexual Activity   Alcohol use: Yes    Comment: Occas. since + UPT,  LAST TIME HAD MARIJUANA- SEPT.Marland Kitchen  LAST TIME HAD ALCOHOL- [place] EV[facility]   Drug use: Not Currently    Comment: None since + UPT   Sexual activity: Yes    Partners: Male    Birth control/protection: Surgical  Other Topics Concern   Not on file  Social History Narrative   Exercise --- no    Social Determinants of Health   Financial Resource Strain: Not on file  Food Insecurity: Not on file  Transportation Needs: Not on file  Physical Activity: Not on file  Stress: Not on file  Social Connections: Not on file  Intimate Partner Violence: Not on file    Outpatient Medications Prior to Visit  Medication Sig  Dispense Refill   cetirizine (ZYRTEC) 10 MG tablet Take 10 mg by mouth daily.     lisdexamfetamine (VYVANSE) 40 MG capsule Take 1 capsule (40 mg total) by mouth every morning. 30 capsule 0   nicotine (NICODERM CQ - DOSED IN MG/24 HR) 7 mg/24hr patch Place 1 patch (7 mg total) onto the skin daily. 28 patch 0   No facility-administered medications prior to visit.    Allergies  Allergen Reactions   Amoxicillin Hives, Shortness Of Breath and Swelling    Can't take any cilllins   Penicillins Hives, Shortness Of Breath and Swelling   Corn-Containing Products    Lisinopril Other (See Comments)    Throat itching   Amlodipine Palpitations    Review of Systems  Constitutional:  Negative for fever.       (+)occasional hot flashes  HENT:  Negative for congestion, sinus pain and sore throat.   Respiratory:  Negative for cough, shortness of breath and wheezing.   Cardiovascular:  Negative for chest pain and palpitations.  Gastrointestinal:  Negative for abdominal pain, constipation, diarrhea, nausea and vomiting.  Genitourinary:  Negative for dysuria, frequency and hematuria.  Musculoskeletal:        (-)new muscle pain (-)new joint pain  Skin:        (-)New moles  Neurological:  Negative for headaches.       Objective:    Physical Exam Constitutional:      General: She is not in acute distress.    Appearance: Normal appearance. She is not ill-appearing.  HENT:     Head: Normocephalic and atraumatic.     Right Ear: Tympanic membrane, ear canal and external ear normal.     Left Ear: Tympanic membrane, ear canal and external ear normal.  Eyes:     Extraocular Movements: Extraocular movements intact.     Pupils: Pupils are equal, round, and reactive to light.  Cardiovascular:     Rate and Rhythm: Normal rate and regular rhythm.     Heart sounds: Normal heart sounds. No murmur heard.    No gallop.  Pulmonary:     Effort: Pulmonary effort is normal. No respiratory distress.      Breath sounds: Normal breath sounds. No wheezing or rales.  Abdominal:     General: Bowel sounds are normal. There is no distension.     Palpations: Abdomen is soft.     Tenderness: There is no abdominal tenderness. There is no guarding.  Skin:    General: Skin is warm and dry.  Neurological:     Mental Status: She is alert and oriented to person, place, and time.  Psychiatric:        Judgment: Judgment normal.     BP (!) 148/92 (BP Location: Left Arm, Patient Position: Sitting, Cuff Size: Small)   Pulse 80   Temp 98.2 F (36.8 C) (Oral)   Resp 16   Ht 5' 2.5" (1.588 m)   Wt 141 lb (64 kg)   SpO2 100%   BMI 25.38 kg/m  Wt Readings from Last 3 Encounters:  11/24/22 141 lb (64 kg)  11/22/21 137 lb 3.2 oz (62.2 kg)  04/02/21 128 lb 12.8 oz (58.4 kg)       Assessment & Plan:  Preventative health care -     CBC with Differential/Platelet -     Comprehensive metabolic panel -     Lipid panel -     TSH  Attention deficit disorder (ADD) in adult -     Drug Monitoring Panel [phone] , Urine  Colon cancer screening -     Ambulatory referral to Gastroenterology  Mixed hyperlipidemia Assessment & Plan: Encourage heart healthy diet such as MIND or DASH diet, increase exercise, avoid trans fats, simple carbohydrates and processed foods, consider a krill or fish or flaxseed oil cap daily.     Attention deficit disorder (ADD) without hyperactivity Assessment & Plan: Stable  Uds and contract updated  Database reviewed    Other orders -     DM TEMPLATE    I, Donato Schultz, DO, personally preformed the services described in this documentation.  All medical record entries made by the scribe were at my direction and in my presence.  I have reviewed the chart and discharge instructions (if applicable) and agree that the record reflects my personal performance and is accurate and complete. 11/24/2022   I,Shehryar Baig,acting as a scribe for Donato Schultz, DO.,have  documented all relevant documentation on the behalf of Donato Schultz, DO,as directed by  Donato Schultz, DO while in the presence of Donato Schultz, DO.   Donato Schultz, DO

## 2022-11-25 LAB — COMPREHENSIVE METABOLIC PANEL
ALT: 13 U/L (ref 0–35)
AST: 13 U/L (ref 0–37)
Albumin: 4.4 g/dL (ref 3.5–5.2)
Alkaline Phosphatase: 63 U/L (ref 39–117)
BUN: 11 mg/dL (ref 6–23)
CO2: 28 mEq/L (ref 19–32)
Calcium: 10.1 mg/dL (ref 8.4–10.5)
Chloride: 101 mEq/L (ref 96–112)
Creatinine, Ser: 0.71 mg/dL (ref 0.40–1.20)
GFR: 100.94 mL/min (ref >60.00)
Glucose, Bld: 81 mg/dL (ref 70–99)
Potassium: 4.7 mEq/L (ref 3.5–5.1)
Sodium: 138 mEq/L (ref 135–145)
Total Bilirubin: 0.5 mg/dL (ref 0.2–1.2)
Total Protein: 7.7 g/dL (ref 6.0–8.3)

## 2022-11-25 LAB — CBC WITH DIFFERENTIAL/PLATELET
Basophils Absolute: 0.1 10*3/uL (ref 0.0–0.1)
Basophils Relative: 1 % (ref 0.0–3.0)
Eosinophils Absolute: 0.2 10*3/uL (ref 0.0–0.7)
Eosinophils Relative: 2.2 % (ref 0.0–5.0)
HCT: 43.4 % (ref 36.0–46.0)
Hemoglobin: 14.4 g/dL (ref 12.0–15.0)
Lymphocytes Relative: 25.8 % (ref 12.0–46.0)
Lymphs Abs: 2 10*3/uL (ref 0.7–4.0)
MCHC: 33.3 g/dL (ref 30.0–36.0)
MCV: 89.2 fl (ref 78.0–100.0)
Monocytes Absolute: 0.5 10*3/uL (ref 0.1–1.0)
Monocytes Relative: 6.7 % (ref 3.0–12.0)
Neutro Abs: 5 10*3/uL (ref 1.4–7.7)
Neutrophils Relative %: 64.3 % (ref 43.0–77.0)
Platelets: 323 10*3/uL (ref 150.0–400.0)
RBC: 4.86 Mil/uL (ref 3.87–5.11)
RDW: 15.2 % (ref 11.5–15.5)
WBC: 7.7 10*3/uL (ref 4.0–10.5)

## 2022-11-25 LAB — LIPID PANEL
Cholesterol: 250 mg/dL — ABNORMAL HIGH (ref 0–200)
HDL: 76 mg/dL (ref >39.00)
LDL Cholesterol: 142 mg/dL — ABNORMAL HIGH (ref 0–99)
NonHDL: 173.52
Total CHOL/HDL Ratio: 3
Triglycerides: 156 mg/dL — ABNORMAL HIGH (ref 0.0–149.0)
VLDL: 31.2 mg/dL (ref 0.0–40.0)

## 2022-11-25 LAB — TSH: TSH: 2 u[IU]/mL (ref 0.35–5.50)

## 2022-11-26 LAB — DM TEMPLATE

## 2022-11-26 LAB — DRUG MONITORING PANEL 376104, URINE
Amphetamine: 1612 ng/mL — ABNORMAL HIGH (ref <250)
Amphetamines: POSITIVE ng/mL — AB (ref <500)
Barbiturates: NEGATIVE ng/mL (ref <300)
Benzodiazepines: NEGATIVE ng/mL (ref <100)
Cocaine Metabolite: NEGATIVE ng/mL (ref <150)
Desmethyltramadol: NEGATIVE ng/mL (ref <100)
Methamphetamine: NEGATIVE ng/mL (ref <250)
Opiates: NEGATIVE ng/mL (ref <100)
Oxycodone: NEGATIVE ng/mL (ref <100)
Tramadol: NEGATIVE ng/mL (ref <100)

## 2022-11-26 NOTE — Assessment & Plan Note (Signed)
Encourage heart healthy diet such as MIND or DASH diet, increase exercise, avoid trans fats, simple carbohydrates and processed foods, consider a krill or fish or flaxseed oil cap daily.  °

## 2022-11-26 NOTE — Assessment & Plan Note (Addendum)
Stable  Uds and contract updated  Database reviewed

## 2022-12-13 ENCOUNTER — Other Ambulatory Visit: Payer: Self-pay | Admitting: Family Medicine

## 2022-12-13 DIAGNOSIS — F988 Other specified behavioral and emotional disorders with onset usually occurring in childhood and adolescence: Secondary | ICD-10-CM

## 2022-12-13 MED ORDER — LISDEXAMFETAMINE DIMESYLATE 40 MG PO CAPS
40.0000 mg | ORAL_CAPSULE | ORAL | 0 refills | Status: DC
Start: 2022-12-13 — End: 2023-01-13

## 2022-12-13 NOTE — Telephone Encounter (Signed)
Requesting: Vyvanse 40mg   Contract: 04/02/21 UDS: 11/24/22 Last Visit: 11/24/22 Next Visit: None Last Refill: 11/10/22 #30 and 0RF  Please Advise

## 2022-12-15 ENCOUNTER — Other Ambulatory Visit (INDEPENDENT_AMBULATORY_CARE_PROVIDER_SITE_OTHER): Payer: Managed Care, Other (non HMO)

## 2022-12-15 ENCOUNTER — Other Ambulatory Visit: Payer: Self-pay

## 2022-12-15 ENCOUNTER — Telehealth: Payer: Self-pay | Admitting: Family Medicine

## 2022-12-15 DIAGNOSIS — R300 Dysuria: Secondary | ICD-10-CM

## 2022-12-15 LAB — POC URINALSYSI DIPSTICK (AUTOMATED)
Blood, UA: NEGATIVE
Glucose, UA: NEGATIVE
Leukocytes, UA: NEGATIVE
Nitrite, UA: NEGATIVE
Protein, UA: POSITIVE — AB
Spec Grav, UA: >=1.03 — AB (ref 1.010–1.025)
Urobilinogen, UA: 0.2 E.U./dL
pH, UA: 6 (ref 5.0–8.0)

## 2022-12-15 NOTE — Telephone Encounter (Signed)
Pt called stating that she believes she has a kidney infection and would like to look into order for a urine culture.

## 2022-12-15 NOTE — Telephone Encounter (Signed)
Pt called and scheduled

## 2022-12-15 NOTE — Telephone Encounter (Signed)
Orders placed. Pt needs lab appointment please 

## 2022-12-16 ENCOUNTER — Telehealth: Payer: Self-pay | Admitting: Family Medicine

## 2022-12-16 ENCOUNTER — Other Ambulatory Visit: Payer: Self-pay

## 2022-12-16 LAB — URINE CULTURE
MICRO NUMBER:: 14965754
SPECIMEN QUALITY:: ADEQUATE

## 2022-12-16 MED ORDER — CIPROFLOXACIN HCL 250 MG PO TABS: 250.0000 mg | ORAL_TABLET | Freq: Two times a day (BID) | ORAL | 0 refills | Status: AC

## 2022-12-16 NOTE — Telephone Encounter (Signed)
Culture is not back yet. Please advise.

## 2022-12-16 NOTE — Telephone Encounter (Signed)
Pt made aware Rx sent.  

## 2022-12-16 NOTE — Telephone Encounter (Signed)
Pt called stating that her symptoms have gotten worse and she is wondering what is going on. Pt presents with the following as of 5.17.24 @ 10:36a:  -Right Kidney Lower Back Burning Stabbing Pain -Pain is radiating down to Hip Area -Pain is Impacting Sleep -10 out of 10 pain  Please Advise.

## 2023-01-13 ENCOUNTER — Other Ambulatory Visit: Payer: Self-pay | Admitting: Family Medicine

## 2023-01-13 DIAGNOSIS — F988 Other specified behavioral and emotional disorders with onset usually occurring in childhood and adolescence: Secondary | ICD-10-CM

## 2023-01-13 MED ORDER — LISDEXAMFETAMINE DIMESYLATE 40 MG PO CAPS
40.0000 mg | ORAL_CAPSULE | ORAL | 0 refills | Status: DC
Start: 2023-01-13 — End: 2023-02-14

## 2023-01-13 NOTE — Telephone Encounter (Signed)
Requesting: Vyvanse       Contract: 12/01/2022 UDS: 11/24/2022 Last Visit: 11/24/2022 Next Visit: N/A Last Refill: 12/13/2022  Please Advise

## 2023-02-13 ENCOUNTER — Other Ambulatory Visit: Payer: Self-pay | Admitting: Family Medicine

## 2023-02-13 DIAGNOSIS — F988 Other specified behavioral and emotional disorders with onset usually occurring in childhood and adolescence: Secondary | ICD-10-CM

## 2023-02-13 NOTE — Telephone Encounter (Signed)
Prescription Request  02/13/2023  Is this a "Controlled Substance" medicine? Yes  LOV: 11/24/2022  What is the name of the medication or equipment?   lisdexamfetamine (VYVANSE) 40 MG capsule [[id number]]   Have you contacted your pharmacy to request a refill? No   Which pharmacy would you like this sent to?   [facility] - [place], [place] - [address] [place] [address] Phone: [phone] Fax: [phone]  Patient notified that their request is being sent to the clinical staff for review and that they should receive a response within 2 business days.   Please advise at Mobile [phone] (mobile)

## 2023-02-14 MED ORDER — LISDEXAMFETAMINE DIMESYLATE 40 MG PO CAPS
40.0000 mg | ORAL_CAPSULE | ORAL | 0 refills | Status: DC
Start: 2023-02-14 — End: 2023-03-17

## 2023-02-14 NOTE — Addendum Note (Signed)
Addended by: Roxanne Gates on: 02/14/2023 09:07 AM   Modules accepted: Orders

## 2023-02-14 NOTE — Telephone Encounter (Signed)
Requesting: Vyvanse Contract: 11/24/22 UDS: 11/24/22 Last OV: 11/24/22 Next OV: N/A Last Refill: 01/13/23, #30--0 RF Database:   Please advise

## 2023-03-17 ENCOUNTER — Other Ambulatory Visit: Payer: Self-pay | Admitting: Family Medicine

## 2023-03-17 DIAGNOSIS — F988 Other specified behavioral and emotional disorders with onset usually occurring in childhood and adolescence: Secondary | ICD-10-CM

## 2023-03-17 MED ORDER — LISDEXAMFETAMINE DIMESYLATE 40 MG PO CAPS
40.0000 mg | ORAL_CAPSULE | ORAL | 0 refills | Status: DC
Start: 2023-03-17 — End: 2023-04-18

## 2023-03-17 NOTE — Telephone Encounter (Signed)
Requesting: Vyvanse 40mg   Contract: 12/01/22 UDS: 11/24/22 Last Visit: 11/24/22 Next Visit: None Last Refill: 02/14/23 #30 and 0RF  Please Advise

## 2023-04-18 ENCOUNTER — Other Ambulatory Visit: Payer: Self-pay | Admitting: Family Medicine

## 2023-04-18 DIAGNOSIS — F988 Other specified behavioral and emotional disorders with onset usually occurring in childhood and adolescence: Secondary | ICD-10-CM

## 2023-04-19 MED ORDER — LISDEXAMFETAMINE DIMESYLATE 40 MG PO CAPS
40.0000 mg | ORAL_CAPSULE | ORAL | 0 refills | Status: DC
Start: 2023-04-19 — End: 2023-05-23

## 2023-04-19 NOTE — Telephone Encounter (Signed)
Requesting: Vyvanse Contract: 11/24/2022 UDS: 11/24/2022 Last Visit: 11/24/2022 Next Visit: N/A Last Refill: 03/17/2023  Please Advise

## 2023-05-22 LAB — HM MAMMOGRAPHY

## 2023-05-23 ENCOUNTER — Other Ambulatory Visit: Payer: Self-pay | Admitting: Family Medicine

## 2023-05-23 DIAGNOSIS — F988 Other specified behavioral and emotional disorders with onset usually occurring in childhood and adolescence: Secondary | ICD-10-CM

## 2023-05-23 NOTE — Telephone Encounter (Signed)
Requesting: Vyvanse 40mg   Contract: 11/24/22 UDS: 11/24/22 Last Visit: 11/24/22 Next Visit: None Last Refill: 04/19/23 #30 and 0RF   Please Advise

## 2023-05-23 NOTE — Addendum Note (Signed)
Addended byConrad  D on: 05/23/2023 05:34 PM   Modules accepted: Orders

## 2023-05-23 NOTE — Telephone Encounter (Signed)
Prescription Request  05/23/2023  Is this a "Controlled Substance" medicine? No  LOV: 11/24/2022  What is the name of the medication or equipment? lisdexamfetamine (VYVANSE) 40 MG capsule  Have you contacted your pharmacy to request a refill? Yes   Which pharmacy would you like this sent to?  [facility] - [place], [place] - [address] [facility] [address] [place] [address] Phone: [phone] Fax: [phone]     Patient notified that their request is being sent to the clinical staff for review and that they should receive a response within 2 business days.   Please advise at [facility] [phone]

## 2023-05-24 MED ORDER — LISDEXAMFETAMINE DIMESYLATE 40 MG PO CAPS
40.0000 mg | ORAL_CAPSULE | ORAL | 0 refills | Status: DC
Start: 2023-05-24 — End: 2023-06-23

## 2023-06-23 ENCOUNTER — Other Ambulatory Visit: Payer: Self-pay | Admitting: Family Medicine

## 2023-06-23 DIAGNOSIS — F988 Other specified behavioral and emotional disorders with onset usually occurring in childhood and adolescence: Secondary | ICD-10-CM

## 2023-06-23 NOTE — Telephone Encounter (Signed)
Requesting: Vyvanse 40mg   Contract: 11/24/22 UDS: 11/24/22 Last Visit:  11/24/22  Next Visit None Last Refill: 05/24/23 #30 and 0RF   Please Advise

## 2023-06-24 MED ORDER — LISDEXAMFETAMINE DIMESYLATE 40 MG PO CAPS
40.0000 mg | ORAL_CAPSULE | ORAL | 0 refills | Status: DC
Start: 2023-06-24 — End: 2023-08-01

## 2023-08-01 ENCOUNTER — Other Ambulatory Visit: Payer: Self-pay | Admitting: Family Medicine

## 2023-08-01 DIAGNOSIS — F988 Other specified behavioral and emotional disorders with onset usually occurring in childhood and adolescence: Secondary | ICD-10-CM

## 2023-08-01 NOTE — Telephone Encounter (Signed)
 Requesting: Vyvanse 40mg   Contract: 12/01/22  UDS: 11/24/22 Last Visit: 11/24/22 Next Visit: None Last Refill: 06/24/23 #30 and 0RF   Please Advise

## 2023-08-03 MED ORDER — LISDEXAMFETAMINE DIMESYLATE 40 MG PO CAPS
40.0000 mg | ORAL_CAPSULE | ORAL | 0 refills | Status: DC
Start: 2023-08-03 — End: 2023-08-28

## 2023-08-28 ENCOUNTER — Other Ambulatory Visit: Payer: Self-pay | Admitting: Family Medicine

## 2023-08-28 DIAGNOSIS — F988 Other specified behavioral and emotional disorders with onset usually occurring in childhood and adolescence: Secondary | ICD-10-CM

## 2023-08-28 MED ORDER — LISDEXAMFETAMINE DIMESYLATE 40 MG PO CAPS
40.0000 mg | ORAL_CAPSULE | ORAL | 0 refills | Status: DC
Start: 2023-08-28 — End: 2023-10-06

## 2023-08-28 NOTE — Telephone Encounter (Signed)
Requesting: Vyvanse 40 mg  Contract: 11/24/2022 UDS: 11/24/2022 Last Visit: 11/24/2022 Next Visit: N/A Last Refill: 08/03/2023  Please Advise

## 2023-10-06 ENCOUNTER — Other Ambulatory Visit: Payer: Self-pay | Admitting: Family Medicine

## 2023-10-06 DIAGNOSIS — F988 Other specified behavioral and emotional disorders with onset usually occurring in childhood and adolescence: Secondary | ICD-10-CM

## 2023-10-06 MED ORDER — LISDEXAMFETAMINE DIMESYLATE 40 MG PO CAPS
40.0000 mg | ORAL_CAPSULE | ORAL | 0 refills | Status: DC
Start: 2023-10-06 — End: 2023-11-23

## 2023-10-06 NOTE — Telephone Encounter (Signed)
 Requesting: vyvanse Contract:12/21/22 UDS:12/21/22 Last Visit:11/24/22 Next Visit: n/a Last Refill:08/28/23  Please Advise

## 2023-11-21 ENCOUNTER — Other Ambulatory Visit: Payer: Self-pay | Admitting: Family Medicine

## 2023-11-21 DIAGNOSIS — F988 Other specified behavioral and emotional disorders with onset usually occurring in childhood and adolescence: Secondary | ICD-10-CM

## 2023-11-21 NOTE — Telephone Encounter (Signed)
 Requesting: vyvanse  40mg  Contract: Yes UDS: 11/24/22 Last Visit: 11/24/2022 Next Visit: Visit date not found Last Refill: 10/06/23   Please Advise

## 2023-11-23 ENCOUNTER — Ambulatory Visit: Admitting: Family Medicine

## 2023-11-23 ENCOUNTER — Encounter: Payer: Self-pay | Admitting: Family Medicine

## 2023-11-23 VITALS — BP 134/90 | HR 80 | Temp 98.2°F | Resp 16 | Ht 62.5 in | Wt 141.8 lb

## 2023-11-23 DIAGNOSIS — Z79899 Other long term (current) drug therapy: Secondary | ICD-10-CM | POA: Diagnosis not present

## 2023-11-23 DIAGNOSIS — F988 Other specified behavioral and emotional disorders with onset usually occurring in childhood and adolescence: Secondary | ICD-10-CM

## 2023-11-23 MED ORDER — LISDEXAMFETAMINE DIMESYLATE 40 MG PO CAPS: 40.0000 mg | ORAL_CAPSULE | ORAL | 0 refills | Status: DC

## 2023-11-23 NOTE — Patient Instructions (Signed)
 Living With Attention Deficit Hyperactivity Disorder If you have been diagnosed with attention deficit hyperactivity disorder (ADHD), you may be relieved that you now know why you have felt or behaved a certain way. Still, you may feel overwhelmed about the treatment ahead. You may also wonder how to get the support you need and how to deal with the condition day-to-day. With treatment and support, you can live with ADHD and manage your symptoms. How to manage lifestyle changes Managing lifestyle changes can be challenging. Seeking support from your healthcare provider, therapist, family, and friends can be helpful. How to recognize changes in your condition The following signs may mean that your treatment is working well and your condition is improving: Consistently being on time for appointments. Being more organized at home and work. Other people noticing improvements in your behavior. Achieving goals that you set for yourself. Thinking more clearly. The following signs may mean that your treatment is not working very well: Feeling impatience or more confusion. Missing, forgetting, or being late for appointments. An increasing sense of disorganization and messiness. More difficulty in reaching goals that you set for yourself. Loved ones becoming angry or frustrated with you. Follow these instructions at home: Medicines Take over-the-counter and prescription medicines only as told by your health care provider. Check with your health care provider before taking any new medicines. General instructions Create structure and an organized atmosphere at home. For example: Make a list of tasks, then rank them from most important to least important. Work on one task at a time until your listed tasks are done. Make a daily schedule and follow it consistently every day. Use an appointment calendar, and check it 2-3 times a day to keep on track. Keep it with you when you leave the house. Create  spaces where you keep certain things, and always put things back in their places after you use them. Keep all follow-up visits. Your health care provider will need to monitor your condition and adjust your treatment over time. Where to find support Talking to others  Keep emotion out of important discussions and speak in a calm, logical way. Listen closely and patiently to your loved ones. Try to understand their point of view, and try to avoid getting defensive. Take responsibility for the consequences of your actions. Ask that others do not take your behaviors personally. Aim to solve problems as they come up, and express your feelings instead of bottling them up. Talk openly about what you need from your loved ones and how they can support you. Consider going to family therapy sessions or having your family meet with a specialist who deals with ADHD-related behavior problems. Finances Not all insurance plans cover mental health care, so it is important to check with your insurance carrier. If paying for co-pays or counseling services is a problem, search for a local or county mental health care center. Public mental health care services may be offered there at a low cost or no cost when you are not able to see a private health care provider. If you are taking medicine for ADHD, you may be able to get the generic form, which may be less expensive than brand-name medicine. Some makers of prescription medicines also offer help to patients who cannot afford the medicines that they need. Therapy and support groups Talking with a mental health care provider and participating in support groups can help to improve your quality of life, daily functioning, and overall symptoms. Questions to ask your health  care provider: What are the risks and benefits of taking medicines? Would I benefit from therapy? How often should I follow up with a health care provider? Where to find more information Learn more  about ADHD from: Children and Adults with Attention Deficit Hyperactivity Disorder: [employer] of Mental Health: BloggerCourse.com Centers for Disease Control and Prevention: TonerPromos.no Contact a health care provider if: You have side effects from your medicines, such as: Repeated muscle twitches, coughing, or speech outbursts. Sleep problems. Loss of appetite. Dizziness. Unusually fast heartbeat. Stomach pains. Headaches. You have new or worsening behavior problems. You are struggling with anxiety, depression, or substance abuse. Get help right away if: You have a severe reaction to a medicine. These symptoms may be an emergency. Get help right away. Call 911. Do not wait to see if the symptoms will go away. Do not drive yourself to the hospital. Take one of these steps if you feel like you may hurt yourself or others, or have thoughts about taking your own life: Go to your nearest emergency room. Call 911. Call the National Suicide Prevention Lifeline at (989) 115-0802 or 988. This is open 24 hours a day. Text the Crisis Text Line at 802-008-3988. Summary With treatment and support, you can live with ADHD and manage your symptoms. Consider taking part in family therapy or self-help groups with family members or friends. When you talk with friends and family about your ADHD, be patient and communicate openly. Keep all follow-up visits. Your health care provider will need to monitor your condition and adjust your treatment over time. This information is not intended to replace advice given to you by your health care provider. Make sure you discuss any questions you have with your health care provider. Document Revised: 11/05/2021 Document Reviewed: 11/05/2021 Elsevier Patient Education  2024 [employer].

## 2023-11-23 NOTE — Progress Notes (Signed)
 Established Patient Office Visit  Subjective   Patient ID: Beth Glover, female    DOB: 1976-02-09  Age: 48 y.o. MRN: [id number]  Chief Complaint  Patient presents with  . ADD  . Follow-up    HPI Discussed the use of AI scribe software for clinical note transcription with the patient, who gave verbal consent to proceed.  History of Present Illness Beth Glover is a 48 year old female who presents for medication management.  She is currently taking Vyvanse  40 mg, which she obtains from [employer]. The monthly prescription request system is effective for her. She is uncertain if her insurance permits sending three prescriptions at once, as some pharmacies do not allow this.  Her blood pressure is slightly elevated, which she attributes to returning from vacation and resuming work. 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 Her workload has increased as she is covering for a colleague on vacation.  No chest pain.   Patient Active Problem List   Diagnosis Date Noted  . Dysuria 01/20/2021  . Attention deficit disorder (ADD) without hyperactivity 05/28/2020  . Anxiety 04/30/2019  . Hyperlipidemia 04/30/2019  . Essential hypertension 12/18/2017  . Syncope 12/18/2017  . S/P cesarean section 01/02/2013  . Normal pregnancy in multigravida 12/31/2012   Past Medical History:  Diagnosis Date  . Abnormal Pap smear   . Allergy   . AMA (advanced maternal age) multigravida 35+   . Bronchitis   . Chicken pox   . Chronic kidney disease    R ureteral reflux  . GERD (gastroesophageal reflux disease)   . H/O pre-eclampsia in prior pregnancy, currently pregnant   . Heartburn   . Hx of pyelonephritis   . Hypertension   . Normal pregnancy, repeat 12/31/2012  . S/P cesarean section 01/02/2013  . Ureteral reflux    right   Past Surgical  History:  Procedure Laterality Date  . BILATERAL SALPINGECTOMY Bilateral 01/02/2013   Procedure: BILATERAL SALPINGECTOMY;  Surgeon: Chrystal Crape, MD;  Location: [facility];  Service: Obstetrics;  Laterality: Bilateral;  . CESAREAN SECTION N/A 01/02/2013   Procedure: CESAREAN SECTION;  Surgeon: Chrystal Crape, MD;  Location: [facility];  Service: Obstetrics;  Laterality: N/A;  Primary Cesarean Section Delivery Baby Boy @ 0410, Apgars   . KIDNEY SURGERY     Correct R ureteral reflux, 2002  . TONSILLECTOMY     Social History   Tobacco Use  . Smoking status: Some Days    Current packs/day: 0.25    Average packs/day: 0.3 packs/day for 34.0 years (8.5 ttl pk-yrs)    Types: Cigarettes  . Smokeless tobacco: Never  . Tobacco comments:    down to 4-5 a day(12/18/17)  Substance Use Topics  . Alcohol use: Yes    Comment: Occas. since + UPT,  LAST TIME HAD MARIJUANA- SEPT.Aaron Aas  LAST TIME HAD ALCOHOL- [place] EV[place]  . Drug use: Not Currently    Comment: None since + UPT   Social History   Socioeconomic History  . Marital status: Significant Other    Spouse name: Not on file  . Number of children: Not on file  . Years of education: Not on file  . Highest education level: Not on file  Occupational History  . Not on file  Tobacco Use  . Smoking status: Some Days    Current packs/day: 0.25  Average packs/day: 0.3 packs/day for 34.0 years (8.5 ttl pk-yrs)    Types: Cigarettes  . Smokeless tobacco: Never  . Tobacco comments:    down to 4-5 a day(12/18/17)  Substance and Sexual Activity  . Alcohol use: Yes    Comment: Occas. since + UPT,  LAST TIME HAD MARIJUANA- SEPT.Aaron Aas  LAST TIME HAD ALCOHOL- [place] EV[place]  . Drug use: Not Currently    Comment: None since + UPT  . Sexual activity: Yes    Partners: Male    Birth control/protection: Surgical  Other Topics Concern  . Not on file  Social History Narrative   Exercise --- no    Social Drivers of Corporate investment banker Strain: Not on file  Food Insecurity: Not  on file  Transportation Needs: Not on file  Physical Activity: Not on file  Stress: Not on file  Social Connections: Unknown (12/14/2021)   [facility], [facility]   Social Network   . Social Network: Not on file  Intimate Partner Violence: Unknown (11/05/2021)   Received from [facility], [facility]   HITS   . Physically Hurt: Not on file   . Insult or Talk Down To: Not on file   . Threaten Physical Harm: Not on file   . Scream or Curse: Not on file   Family Status  Relation Name Status  . Mother  (Not Specified)  . Father  (Not Specified)  . Sister Event organiser  . Daughter  (Not Specified)  . MGM  (Not Specified)  . PGM  (Not Specified)  . Sister Sallyann Crea  . Sister Barbar Bonus Other  . Sister joey Alive  No partnership data on file   Family History  Problem Relation Age of Onset  . Cancer Mother   . Miscarriages / [place] Mother   . Breast cancer Mother   . Cancer Father   . Diabetes Father   . Diabetes Sister   . Miscarriages / Stillbirths Sister   . Depression Daughter   . Heart disease Maternal Grandmother   . Stroke Paternal Grandmother   . Cancer Sister   . Hypertension Sister   . Early death Sister   . Asthma Sister   . Mental retardation Sister    Allergies  Allergen Reactions  . Amoxicillin Hives, Shortness Of Breath and Swelling    Can't take any cilllins  . Penicillins Hives, Shortness Of Breath and Swelling  . Corn-Containing Products   . Lisinopril Other (See Comments)    Throat itching  . Amlodipine Palpitations   +    Review of Systems  Constitutional:  Negative for fever and malaise/fatigue.  HENT:  Negative for congestion.   Eyes:  Negative for blurred vision.  Respiratory:  Negative for cough and shortness of breath.   Cardiovascular:  Negative for chest pain, palpitations and leg swelling.  Gastrointestinal:  Negative for vomiting.  Musculoskeletal:  Negative for back pain.  Skin:  Negative for rash.   Neurological:  Negative for loss of consciousness and headaches.     Objective:     BP (!) 134/90 (BP Location: Left Arm, Patient Position: Sitting, Cuff Size: Normal)   Pulse 80   Temp 98.2 F (36.8 C) (Oral)   Resp 16   Ht 5' 2.5" (1.588 m)   Wt 141 lb 12.8 oz (64.3 kg)   SpO2 98%   BMI 25.52 kg/m  BP Readings from Last 3 Encounters:  11/23/23 (!) 134/90  11/24/22 (!) 148/92  11/22/21 120/78   Wt Readings from Last 3 Encounters:  11/23/23 141 lb 12.8 oz (64.3 kg)  11/24/22 141 lb (64 kg)  11/22/21 137 lb 3.2 oz (62.2 kg)   SpO2 Readings from Last 3 Encounters:  11/23/23 98%  11/24/22 100%  11/22/21 99%      Physical Exam Vitals and nursing note reviewed.  Constitutional:      General: She is not in acute distress.    Appearance: Normal appearance. She is well-developed.  HENT:     Head: Normocephalic and atraumatic.  Eyes:     General: No scleral icterus.       Right eye: No discharge.        Left eye: No discharge.  Cardiovascular:     Rate and Rhythm: Normal rate and regular rhythm.     Heart sounds: No murmur heard. Pulmonary:     Effort: Pulmonary effort is normal. No respiratory distress.     Breath sounds: Normal breath sounds.  Musculoskeletal:        General: Normal range of motion.     Cervical back: Normal range of motion and neck supple.     Right lower leg: No edema.     Left lower leg: No edema.  Skin:    General: Skin is warm and dry.  Neurological:     Mental Status: She is alert and oriented to person, place, and time.  Psychiatric:        Mood and Affect: Mood normal.        Behavior: Behavior normal.        Thought Content: Thought content normal.        Judgment: Judgment normal.    No results found for any visits on 11/23/23.  Last CBC Lab Results  Component Value Date   WBC 7.7 11/24/2022   HGB 14.4 11/24/2022   HCT 43.4 11/24/2022   MCV 89.2 11/24/2022   MCH 30.0 07/27/2018   RDW 15.2 11/24/2022   PLT 323.0  11/24/2022   Last metabolic panel Lab Results  Component Value Date   GLUCOSE 81 11/24/2022   NA 138 11/24/2022   K 4.7 11/24/2022   CL 101 11/24/2022   CO2 28 11/24/2022   BUN 11 11/24/2022   CREATININE 0.71 11/24/2022   GFR 100.94 11/24/2022   CALCIUM  10.1 11/24/2022   PROT 7.7 11/24/2022   ALBUMIN 4.4 11/24/2022   BILITOT 0.5 11/24/2022   ALKPHOS 63 11/24/2022   AST 13 11/24/2022   ALT 13 11/24/2022   Last lipids Lab Results  Component Value Date   CHOL 250 (H) 11/24/2022   HDL 76.00 11/24/2022   LDLCALC 142 (H) 11/24/2022   TRIG 156.0 (H) 11/24/2022   CHOLHDL 3 11/24/2022   Last hemoglobin A1c Lab Results  Component Value Date   HGBA1C 5.9 05/09/2019   Last thyroid  functions Lab Results  Component Value Date   TSH 2.00 11/24/2022   Last vitamin D No results found for: "25OHVITD2", "25OHVITD3", "VD25OH" Last vitamin B12 and Folate No results found for: "VITAMINB12", "FOLATE"    The 10-year ASCVD risk score (Arnett DK, et al., 2019) is: 3.1%    Assessment & Plan:   Problem List Items Addressed This Visit   None Visit Diagnoses       High risk medication use    -  Primary   Relevant Orders   Drug Monitoring Panel [phone] , Urine     Attention deficit disorder (ADD) in adult  Relevant Medications   lisdexamfetamine (VYVANSE ) 40 MG capsule   Other Relevant Orders   Drug Monitoring Panel [phone] , Urine     Assessment and Plan Assessment & Plan Elevated blood pressure   Blood pressure is elevated, likely due to recent vacation and work-related stress. No acute symptoms like chest pain. She attributes the elevation to stress from handling customer service cases and covering for a colleague. Continue current antihypertensive medication regimen.    No follow-ups on file.    Jahel Wavra R Lowne Chase, DO

## 2023-11-25 LAB — DM TEMPLATE

## 2023-11-26 LAB — DRUG MONITORING PANEL 376104, URINE
Barbiturates: NEGATIVE ng/mL (ref <300)
Benzodiazepines: NEGATIVE ng/mL (ref <100)
Cocaine Metabolite: NEGATIVE ng/mL (ref <150)
Desmethyltramadol: NEGATIVE ng/mL (ref <100)
Methamphetamine: NEGATIVE ng/mL (ref <250)
Opiates: NEGATIVE ng/mL (ref <100)
Oxycodone: NEGATIVE ng/mL (ref <100)
Tramadol Comments: 4975 ng/mL — ABNORMAL HIGH (ref <250)
Tramadol: NEGATIVE ng/mL (ref <100)
Tramadol: POSITIVE ng/mL — AB (ref <500)
medMATCH Summary: NEGATIVE ng/mL (ref <100)

## 2023-11-26 LAB — DM TEMPLATE

## 2023-12-28 ENCOUNTER — Other Ambulatory Visit: Payer: Self-pay | Admitting: Family Medicine

## 2023-12-28 DIAGNOSIS — F988 Other specified behavioral and emotional disorders with onset usually occurring in childhood and adolescence: Secondary | ICD-10-CM

## 2023-12-28 MED ORDER — LISDEXAMFETAMINE DIMESYLATE 40 MG PO CAPS
40.0000 mg | ORAL_CAPSULE | ORAL | 0 refills | Status: DC
Start: 2023-12-28 — End: 2024-02-09

## 2023-12-28 NOTE — Telephone Encounter (Signed)
 Copied from CRM [id number]. Topic: Clinical - Medication Refill >> Dec 28, 2023  8:42 AM Jenice Mitts wrote: Medication: lisdexamfetamine (VYVANSE ) 40 MG capsule  Has the patient contacted their pharmacy? Yes (Agent: If no, request that the patient contact the pharmacy for the refill. If patient does not wish to contact the pharmacy document the reason why and proceed with request.) (Agent: If yes, when and what did the pharmacy advise?)  This is the patient's preferred pharmacy:  [facility] - [place], [place] - [address] [place] [address] Phone: [phone] Fax: [phone]   Is this the correct pharmacy for this prescription? Yes If no, delete pharmacy and type the correct one.   Has the prescription been filled recently? No  Is the patient out of the medication? Yes  Has the patient been seen for an appointment in the last year OR does the patient have an upcoming appointment? Yes  Can we respond through MyChart? Yes  Agent: Please be advised that Rx refills may take up to 3 business days. We ask that you follow-up with your pharmacy.

## 2023-12-28 NOTE — Telephone Encounter (Signed)
 Requesting: Vyvanse  40mg   Contract: 11/23/23 UDS: 11/23/23 Last Visit: 11/23/23 Next Visit: None Last Refill: 11/23/23 #30 and 0RF   Please Advise

## 2024-02-09 ENCOUNTER — Telehealth: Payer: Self-pay | Admitting: Family Medicine

## 2024-02-09 ENCOUNTER — Other Ambulatory Visit: Payer: Self-pay | Admitting: Family Medicine

## 2024-02-09 DIAGNOSIS — F988 Other specified behavioral and emotional disorders with onset usually occurring in childhood and adolescence: Secondary | ICD-10-CM

## 2024-02-09 NOTE — Telephone Encounter (Signed)
 Copied from CRM [id number]. Topic: Clinical - Medication Refill >> Feb 09, 2024 10:48 AM Rea C wrote: Medication: lisdexamfetamine (VYVANSE ) 40 MG capsule  Has the patient contacted their pharmacy? Patient tried to do refill through MyChart and it didn't let her.    This is the patient's preferred pharmacy:  [facility] - [place], [place] - [address] [place] [address] Phone: [phone] Fax: [phone]  Is this the correct pharmacy for this prescription? Yes If no, delete pharmacy and type the correct one.   Has the prescription been filled recently? Yes  Is the patient out of the medication? Yes  Has the patient been seen for an appointment in the last year OR does the patient have an upcoming appointment? Yes  Can we respond through MyChart? Yes  Agent: Please be advised that Rx refills may take up to 3 business days. We ask that you follow-up with your pharmacy.

## 2024-02-09 NOTE — Telephone Encounter (Signed)
 Requesting: Vyvanse  40mg   Contract: 11/23/23 UDS: 11/23/23 Last Visit: 11/23/23 Next Visit: None Last Refill: 12/28/23 #30 and 0RF   Please Advise

## 2024-02-09 NOTE — Telephone Encounter (Signed)
Duplicate Rx Request

## 2024-02-12 ENCOUNTER — Ambulatory Visit: Payer: Self-pay

## 2024-02-12 NOTE — Telephone Encounter (Signed)
  FYI Only or Action Required?: FYI only for provider.  Patient was last seen in primary care on 11/23/2023 by Antonio Meth, Jamee SAUNDERS, DO.  Called Nurse Triage reporting Fall and Wrist Injury.  Symptoms began several weeks ago.  Interventions attempted: OTC medications: ibuprofen  and Rest, hydration, or home remedies.  Symptoms are: stable.  Triage Disposition: See Physician Within 24 Hours  Patient/caregiver understands and will follow disposition?: Yes                             Copied from CRM [id number]. Topic: Clinical - Red Word Triage >> Feb 12, 2024  3:25 PM Beth Glover wrote: Red Word that prompted transfer to Nurse Triage: Patient suffered a fall on 02/03/2024 and landed on her right wrist, she has it in a splint she got from the grocery store but it does not seem to be helping, severe pain and overall not healing properly concerned of a possible fracture. Reason for Disposition  Can't move injured wrist normally (bend or straighten completely)  Answer Assessment - Initial Assessment Questions 1. MECHANISM: How did the injury happen?     Slipped on wet steps 2. ONSET: When did the injury happen? (e.g., minutes or hours ago)      1-2 weeks ago 3. APPEARANCE of INJURY: What does the injury look like?      It looks perfectly fine, it just hurts 4. SEVERITY: Can you use your wrist normally? Can you move your wrist back and forth? Can you hold something in your hand?     States it hurts to open things and move certain ways 5. SIZE: For cuts, bruises, or swelling, ask: How large is it? (e.g., inches or centimeters; entire wrist)      Swelling at first, swelling is gone at this time 6. PAIN: How bad is the pain? (Scale 0-10; or none, mild, moderate, severe)     Burning sensation, rates pain about a 5  Protocols used: Wrist Injury-A-AH

## 2024-02-13 ENCOUNTER — Other Ambulatory Visit: Payer: Self-pay | Admitting: Family

## 2024-02-13 ENCOUNTER — Encounter: Payer: Self-pay | Admitting: Family

## 2024-02-13 ENCOUNTER — Ambulatory Visit: Payer: Self-pay | Admitting: Family

## 2024-02-13 ENCOUNTER — Ambulatory Visit: Admitting: Family

## 2024-02-13 ENCOUNTER — Ambulatory Visit (HOSPITAL_BASED_OUTPATIENT_CLINIC_OR_DEPARTMENT_OTHER)
Admission: RE | Admit: 2024-02-13 | Discharge: 2024-02-13 | Disposition: A | Discharge status: Home / Self Care | Source: Ambulatory Visit | Attending: Family | Admitting: Family

## 2024-02-13 VITALS — BP 126/82 | HR 88

## 2024-02-13 DIAGNOSIS — M25531 Pain in right wrist: Secondary | ICD-10-CM | POA: Diagnosis not present

## 2024-02-13 NOTE — Progress Notes (Signed)
 Beth Glover is a 48 y.o. female with the following history as recorded in EpicCare:  Patient Active Problem List   Diagnosis Date Noted   Dysuria 01/20/2021   Attention deficit disorder (ADD) without hyperactivity 05/28/2020   Anxiety 04/30/2019   Hyperlipidemia 04/30/2019   Essential hypertension 12/18/2017   Syncope 12/18/2017   S/P cesarean section 01/02/2013   Normal pregnancy in multigravida 12/31/2012    Current Outpatient Medications  Medication Sig Dispense Refill   cetirizine (ZYRTEC) 10 MG tablet Take 10 mg by mouth daily.     lisdexamfetamine (VYVANSE ) 40 MG capsule TAKE ONE CAPSULE BY MOUTH EVERY MORNING 30 capsule 0   No current facility-administered medications for this visit.    Allergies: Amoxicillin, Penicillins, Corn-containing products, Lisinopril, and Amlodipine  Past Medical History:  Diagnosis Date   Abnormal Pap smear    Allergy    AMA (advanced maternal age) multigravida 35+    Bronchitis    Chicken pox    Chronic kidney disease    R ureteral reflux   GERD (gastroesophageal reflux disease)    H/O pre-eclampsia in prior pregnancy, currently pregnant    Heartburn    Hx of pyelonephritis    Hypertension    Normal pregnancy, repeat 12/31/2012   S/P cesarean section 01/02/2013   Ureteral reflux    right    Past Surgical History:  Procedure Laterality Date   BILATERAL SALPINGECTOMY Bilateral 01/02/2013   Procedure: BILATERAL SALPINGECTOMY;  Surgeon: Ezzie Marshall, MD;  Location: [facility];  Service: Obstetrics;  Laterality: Bilateral;   CESAREAN SECTION N/A 01/02/2013   Procedure: CESAREAN SECTION;  Surgeon: Ezzie Marshall, MD;  Location: [facility];  Service: Obstetrics;  Laterality: N/A;  Primary Cesarean Section Delivery Baby Boy @ 0410, Apgars    KIDNEY SURGERY     Correct R ureteral reflux, 2002   TONSILLECTOMY      Family History  Problem Relation Age of Onset   Cancer Mother    Miscarriages / [place] Mother    Breast cancer Mother    Cancer  Father    Diabetes Father    Diabetes Sister    Miscarriages / [place] Sister    Depression Daughter    Heart disease Maternal Grandmother    Stroke Paternal Grandmother    Cancer Sister    Hypertension Sister    Early death Sister    Asthma Sister    Mental retardation Sister     Social History   Tobacco Use   Smoking status: Some Days    Current packs/day: 0.25    Average packs/day: 0.3 packs/day for 34.0 years (8.5 ttl pk-yrs)    Types: Cigarettes   Smokeless tobacco: Never   Tobacco comments:    down to 4-5 a day(12/18/17)  Substance Use Topics   Alcohol use: Yes    Comment: Occas. since + UPT,  LAST TIME HAD MARIJUANA- SEPT.SABRA  LAST TIME HAD ALCOHOL- [place]    Subjective:   Patient concerned about right wrist pain- notes she fell down a flight of stairs on 02/03/24; iced it initially; does try to wear brace to stabilize; has taken Ibuprofen  intermittently- helps a little bit; concerned that area is continuing to be swollen;   Objective:  Vitals:   02/13/24 0902  BP: 126/82  Pulse: 88  SpO2: 98%    General: Well developed, well nourished, in no acute distress  Skin : Warm and dry.  Head: Normocephalic and atraumatic  Lungs: Respirations unlabored;  Musculoskeletal: No deformities;  swelling noted on lateral side of wrist Extremities: No edema, cyanosis, clubbing  Vessels: Symmetric bilaterally  Neurologic: Alert and oriented; speech intact; face symmetrical; moves all extremities well; CNII-XII intact without focal deficit   Assessment:  1. Right wrist pain     Plan:  ? Missed fracture vs tendonitis; will update STAT X-ray today; will need to get to orthopedist for evaluation as soon as possible; follow up to be determined once X-ray results available.   No follow-ups on file.  Orders Placed This Encounter  Procedures   DG Wrist Complete Right    Standing Status:   Future    Expiration Date:   08/15/2024    Reason for Exam (SYMPTOM  OR DIAGNOSIS REQUIRED):    wrist pain    Is the patient pregnant?:   No    Preferred imaging location?:   [facility]    Requested Prescriptions    No prescriptions requested or ordered in this encounter

## 2024-02-15 ENCOUNTER — Telehealth: Payer: Self-pay

## 2024-02-15 ENCOUNTER — Encounter: Payer: Self-pay | Admitting: Orthopedic Surgery

## 2024-02-15 ENCOUNTER — Ambulatory Visit: Admitting: Orthopedic Surgery

## 2024-02-15 DIAGNOSIS — M25531 Pain in right wrist: Secondary | ICD-10-CM

## 2024-02-15 NOTE — Progress Notes (Signed)
 Beth Glover - 48 y.o. female MRN [id number]  Date of birth: 1975-12-18  Office Visit Note: Visit Date: 02/15/2024 PCP: Antonio Cyndee Jamee JONELLE, DO Referred by: Jason Leita Repine,*  Subjective: No chief complaint on file.  HPI: Beth Glover is a pleasant 48 y.o. female who presents today for evaluation of ongoing right wrist pain after recent mechanical fall approximately 10 days prior.  Injury mechanism described as a slip while going downstairs, broke her fall with the outstretched right wrist on concrete.  Has been seen by her family practice physician, underwent x-rays which were negative.  Continues to have ongoing pain along the radial aspect of the wrist, does have some soreness ulnarly as well.  Denies any significant numbness or tingling.  She is overall healthy and active at baseline.  Does do excessive computer work.  She is currently wearing a wrist brace with mild relief.  Pertinent ROS were reviewed with the patient and found to be negative unless otherwise specified above in HPI.   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 Right wrist pain     Plan: Based on her injury mechanism and workup, concerned that there could be an underlying scaphoid injury.  She is also demonstrating signs and symptoms consistent with de Quervain's tenosynovitis of the right wrist.  In order to better rule out any underlying bony injury, I recommended she undergo CT  scan of the right wrist, with emphasis on the scaphoid.  Will also transition her today to a thumb spica brace for additional protection.  She will return to me after the CT is complete to review results and discuss appropriate next treatment steps.  We discussed the potential for thumb spica bracing versus casting should there be an underlying scaphoid injury.  Should this rule out any scaphoid injury, we could better treat the de Quervain's tenosynovitis with potential cortisone injection in the near future.  She expressed full understanding.  Follow-up: No follow-ups on file.   Meds & Orders: No orders of the defined types were placed in this encounter.   Orders Placed This Encounter  Procedures   CT WRIST RIGHT WO CONTRAST     Procedures: No procedures performed      Clinical History: No specialty comments available.  She reports that she has been smoking cigarettes. She has a 8.5 pack-year smoking history. She has never used smokeless tobacco. No results for input(s): HGBA1C, LABURIC in the last 8760 hours.  Objective:   Vital Signs: LMP 12/14/2023 (Approximate)   Physical Exam  Gen: Well-appearing, in no acute distress; non-toxic CV: Regular Rate. Well-perfused. Warm.  Resp: Breathing unlabored on room air; no wheezing. Psych: Fluid speech in conversation; appropriate affect; normal thought process  Ortho Exam General: Patient is well appearing and in no distress.   Skin and Muscle: No skin changes are apparent to upper extremities.  Muscle bulk and contour normal, no signs of atrophy.     Range of Motion and Palpation Tests: Mobility is full about the elbows with flexion and  extension.  Forearm supination and pronation are 85/85 bilaterally.  Wrist flexion/extension is 75/65 left side, wrist flexion/extension limited secondary to pain right side, approximately 45/45.  Digital flexion and extension are full.    Tenderness elicited over the radial styloid region and the  scaphoid tubercle  No cords or nodules are palpated.  No triggering is observed.   Finklestein test is positive right side, significant pain with associated swelling and tenderness.    Neurologic, Vascular, Motor: Sensation is intact to light touch in the median/radial/ulnar distributions.   Fingers pink and well perfused.  Capillary refill is brisk.     Imaging: No results found. Prior x-rays of the right wrist were reviewed today, no significant bony abnormality  Past Medical/Family/Surgical/Social History: Medications & Allergies reviewed per EMR, new medications updated. Patient Active Problem List   Diagnosis Date Noted   Dysuria 01/20/2021   Attention deficit disorder (ADD) without hyperactivity 05/28/2020   Anxiety 04/30/2019   Hyperlipidemia 04/30/2019   Essential hypertension 12/18/2017   Syncope 12/18/2017   S/P cesarean section 01/02/2013   Normal pregnancy in multigravida 12/31/2012   Past Medical History:  Diagnosis Date   Abnormal Pap smear    Allergy    AMA (advanced maternal age) multigravida 35+    Bronchitis    Chicken pox    Chronic kidney disease    R ureteral reflux   GERD (gastroesophageal reflux disease)    H/O pre-eclampsia in prior pregnancy, currently pregnant    Heartburn    Hx of pyelonephritis    Hypertension    Normal pregnancy, repeat 12/31/2012   S/P cesarean section 01/02/2013   Ureteral reflux    right   Family History  Problem Relation Age of Onset   Cancer Mother    Miscarriages / [place] Mother    Breast cancer Mother    Cancer Father    Diabetes Father    Diabetes Sister    Miscarriages / [place] Sister    Depression Daughter    Heart disease Maternal Grandmother    Stroke Paternal Grandmother    Cancer Sister    Hypertension Sister    Early death Sister    Asthma Sister    Mental retardation Sister    Past Surgical History:  Procedure Laterality Date   BILATERAL SALPINGECTOMY Bilateral 01/02/2013   Procedure:  BILATERAL SALPINGECTOMY;  Surgeon: Ezzie Marshall, MD;  Location: [facility];  Service: Obstetrics;  Laterality: Bilateral;   CESAREAN SECTION N/A 01/02/2013   Procedure: CESAREAN SECTION;  Surgeon: Ezzie Marshall, MD;  Location: [facility];  Service: Obstetrics;  Laterality: N/A;  Primary Cesarean Section Delivery Baby Boy @ 0410, Apgars    KIDNEY SURGERY     Correct R ureteral reflux, 2002   TONSILLECTOMY     Social History   Occupational History   Not on file  Tobacco Use   Smoking status: Some Days    Current packs/day: 0.25    Average packs/day: 0.3 packs/day for 34.0 years (8.5 ttl pk-yrs)    Types: Cigarettes   Smokeless tobacco: Never   Tobacco comments:    down to 4-5 a day(12/18/17)  Substance and Sexual Activity   Alcohol use: Yes    Comment: Occas. since + UPT,  LAST TIME HAD MARIJUANA- SEPT.SABRA  LAST TIME HAD ALCOHOL- [place] EV[place]   Drug use: Not Currently    Comment: None since + UPT   Sexual activity: Yes    Partners: Male    Birth control/protection: Surgical  Daleiza Bacchi Afton Alderton, M.D. [facility], Hand Surgery

## 2024-02-15 NOTE — Telephone Encounter (Signed)
 LVM for to cb to work in for Monday with Dr Erwin

## 2024-02-15 NOTE — Telephone Encounter (Signed)
 Pt was scheduled

## 2024-02-16 ENCOUNTER — Ambulatory Visit
Admission: RE | Admit: 2024-02-16 | Discharge: 2024-02-16 | Disposition: A | Discharge status: Home / Self Care | Source: Ambulatory Visit | Attending: Orthopedic Surgery | Admitting: Orthopedic Surgery

## 2024-02-16 DIAGNOSIS — M25531 Pain in right wrist: Secondary | ICD-10-CM

## 2024-02-19 ENCOUNTER — Ambulatory Visit: Admitting: Orthopedic Surgery

## 2024-02-19 DIAGNOSIS — M25531 Pain in right wrist: Secondary | ICD-10-CM | POA: Diagnosis not present

## 2024-02-19 NOTE — Progress Notes (Signed)
 Beth Glover - 48 y.o. female MRN [id number]  Date of birth: 11/22/1975  Office Visit Note: Visit Date: 02/19/2024 PCP: Antonio Cyndee Jamee JONELLE, DO Referred by: Antonio Cyndee Jamee R, *  Subjective: No chief complaint on file.  HPI: Beth Glover is a pleasant 48 y.o. female who returns today for evaluation of ongoing right wrist pain after recent mechanical fall approximately 10 days prior.  She was sent for a recent CT scan to rule out any scaphoid pathology which was negative.  States that her pain has improved significantly over the past week with immobilization and rest.  Pertinent ROS were reviewed with the patient and found to be negative unless otherwise specified above in HPI.    Assessment & Plan: Visit Diagnoses:  1. Right wrist pain      Plan: Based on her injury mechanism and workup, we were concerned that there could be an underlying scaphoid injury.  Fortunately, the CT scan has ruled out any scaphoid pathology at this time.  I am pleased to see that her clinical examination continues to improve as well with activity modification, bracing and rest.  For the time being, we can forego any significant invasive intervention in the form of injection given that her symptoms are improving.  I have recommended that she return to me in the future should her symptoms of radial sided wrist pain persist for potential treatment of ongoing de Quervain's tenosynovitis.  However, she is experiencing relief already, there is a reasonable chance that this will resolve on its own without any significant intervention.  I instructed her to utilize a thumb spica brace over the next few weeks as needed, can begin to wean from this and resume her activities as tolerated.  She expressed full understanding.  Follow-up: No follow-ups on file.   Meds & Orders: No orders of the defined types were placed in this encounter.   No orders of the defined types were placed in this  encounter.    Procedures: No procedures performed      Clinical History: No specialty comments available.  She reports that she has been smoking cigarettes. She has a 8.5 pack-year smoking history. She has never used smokeless tobacco. No results for input(s): HGBA1C, LABURIC in the last 8760 hours.  Objective:   Vital Signs: LMP 12/14/2023 (Approximate)   Physical Exam  Gen: Well-appearing, in no acute distress; non-toxic CV: Regular Rate. Well-perfused. Warm.  Resp: Breathing unlabored on room air; no wheezing. Psych: Fluid speech in conversation; appropriate affect; normal thought process  Ortho Exam General: Patient is well appearing and in no distress.   Skin and Muscle: No skin changes are apparent to upper extremities.  Muscle bulk and contour normal, no signs of atrophy.     Range of Motion and Palpation Tests: Mobility is full about the elbows with flexion and extension.  Forearm supination and pronation are 85/85 bilaterally.  Wrist flexion/extension is 75/65 bilaterally.  Digital flexion and extension are full.    Mild tenderness elicited over the radial styloid region and the scaphoid tubercle  No cords or nodules are palpated.  No triggering is observed.   Finklestein test is mildly positive right side, moderate pain with minimal swelling and tenderness.    Neurologic, Vascular, Motor: Sensation is intact to light touch in the median/radial/ulnar distributions.   Fingers pink and well perfused.  Capillary refill is brisk.     Imaging: No results found. Results of the CT scan were reviewed  in detail of the right wrist  Past Medical/Family/Surgical/Social History: Medications & Allergies reviewed per EMR, new medications updated. Patient Active Problem List   Diagnosis Date Noted   Dysuria 01/20/2021   Attention deficit disorder (ADD) without hyperactivity 05/28/2020   Anxiety 04/30/2019   Hyperlipidemia 04/30/2019   Essential hypertension 12/18/2017    Syncope 12/18/2017   S/P cesarean section 01/02/2013   Normal pregnancy in multigravida 12/31/2012   Past Medical History:  Diagnosis Date   Abnormal Pap smear    Allergy    AMA (advanced maternal age) multigravida 35+    Bronchitis    Chicken pox    Chronic kidney disease    R ureteral reflux   GERD (gastroesophageal reflux disease)    H/O pre-eclampsia in prior pregnancy, currently pregnant    Heartburn    Hx of pyelonephritis    Hypertension    Normal pregnancy, repeat 12/31/2012   S/P cesarean section 01/02/2013   Ureteral reflux    right   Family History  Problem Relation Age of Onset   Cancer Mother    Miscarriages / [place] Mother    Breast cancer Mother    Cancer Father    Diabetes Father    Diabetes Sister    Miscarriages / [place] Sister    Depression Daughter    Heart disease Maternal Grandmother    Stroke Paternal Grandmother    Cancer Sister    Hypertension Sister    Early death Sister    Asthma Sister    Mental retardation Sister    Past Surgical History:  Procedure Laterality Date   BILATERAL SALPINGECTOMY Bilateral 01/02/2013   Procedure: BILATERAL SALPINGECTOMY;  Surgeon: Ezzie Marshall, MD;  Location: [facility];  Service: Obstetrics;  Laterality: Bilateral;   CESAREAN SECTION N/A 01/02/2013   Procedure: CESAREAN SECTION;  Surgeon: Ezzie Marshall, MD;  Location: [facility];  Service: Obstetrics;  Laterality: N/A;  Primary Cesarean Section Delivery Baby Boy @ 0410, Apgars    KIDNEY SURGERY     Correct R ureteral reflux, 2002   TONSILLECTOMY     Social History   Occupational History   Not on file  Tobacco Use   Smoking status: Some Days    Current packs/day: 0.25    Average packs/day: 0.3 packs/day for 34.0 years (8.5 ttl pk-yrs)    Types: Cigarettes   Smokeless tobacco: Never   Tobacco comments:    down to 4-5 a day(12/18/17)  Substance and Sexual Activity   Alcohol use: Yes    Comment: Occas. since + UPT,  LAST TIME HAD MARIJUANA- SEPT.SABRA  LAST  TIME HAD ALCOHOL- [place] EV[place]   Drug use: Not Currently    Comment: None since + UPT   Sexual activity: Yes    Partners: Male    Birth control/protection: Surgical    Cassara Nida Estela) Arlinda, M.D.  [facility], Hand [facility]

## 2024-02-26 ENCOUNTER — Ambulatory Visit: Admitting: Orthopedic Surgery

## 2024-03-13 ENCOUNTER — Other Ambulatory Visit: Payer: Self-pay | Admitting: Family Medicine

## 2024-03-13 DIAGNOSIS — F988 Other specified behavioral and emotional disorders with onset usually occurring in childhood and adolescence: Secondary | ICD-10-CM

## 2024-03-13 NOTE — Telephone Encounter (Signed)
 Requesting: vyvanse  Contract:12/01/23 UDS:12/01/23 Last Visit:02/13/24 Next Visit:na Last Refill:02/09/24  Please Advise

## 2024-03-13 NOTE — Telephone Encounter (Signed)
 Copied from CRM [id number]. Topic: Clinical - Medication Refill >> Mar 13, 2024 11:05 AM Suzen RAMAN wrote: Medication: lisdexamfetamine (VYVANSE ) 40 MG capsule   Has the patient contacted their pharmacy? Yes   This is the patient's preferred pharmacy:  [facility] - [place], [place] - [address] [place] [address] Phone: [phone] Fax: [phone]   Is this the correct pharmacy for this prescription? Yes If no, delete pharmacy and type the correct one.   Has the prescription been filled recently? No  Is the patient out of the medication? No  Has the patient been seen for an appointment in the last year OR does the patient have an upcoming appointment? Yes  Can we respond through MyChart? Yes  Agent: Please be advised that Rx refills may take up to 3 business days. We ask that you follow-up with your pharmacy.

## 2024-03-14 ENCOUNTER — Other Ambulatory Visit: Payer: Self-pay | Admitting: Family Medicine

## 2024-03-14 DIAGNOSIS — F988 Other specified behavioral and emotional disorders with onset usually occurring in childhood and adolescence: Secondary | ICD-10-CM

## 2024-03-14 MED ORDER — LISDEXAMFETAMINE DIMESYLATE 40 MG PO CAPS
40.0000 mg | ORAL_CAPSULE | Freq: Every morning | ORAL | 0 refills | Status: DC
Start: 2024-03-14 — End: 2024-04-18

## 2024-03-14 NOTE — Telephone Encounter (Signed)
 Requesting: Vyvanse  40mg   Contract: 11/23/23 UDS: 11/23/23 Last Visit: 11/23/23 Next Visit: None Last Refill: 02/09/24 #30 and 0RF   Please Advise

## 2024-04-18 ENCOUNTER — Other Ambulatory Visit: Payer: Self-pay | Admitting: Family Medicine

## 2024-04-18 DIAGNOSIS — F988 Other specified behavioral and emotional disorders with onset usually occurring in childhood and adolescence: Secondary | ICD-10-CM

## 2024-04-18 MED ORDER — LISDEXAMFETAMINE DIMESYLATE 40 MG PO CAPS: 40.0000 mg | ORAL_CAPSULE | Freq: Every morning | ORAL | 0 refills | Status: DC

## 2024-04-18 NOTE — Telephone Encounter (Signed)
 Copied from CRM [id number]. Topic: Clinical - Medication Refill >> Apr 18, 2024  9:56 AM Thersia C wrote: Medication: lisdexamfetamine (VYVANSE ) 40 MG capsule  Has the patient contacted their pharmacy? Yes (Agent: If no, request that the patient contact the pharmacy for the refill. If patient does not wish to contact the pharmacy document the reason why and proceed with request.) (Agent: If yes, when and what did the pharmacy advise?)  This is the patient's preferred pharmacy:  [facility] - [place], [place] - [address] [address] [place] [address] Phone: [phone] Fax: [phone]   Is this the correct pharmacy for this prescription? Yes If no, delete pharmacy and type the correct one.   Has the prescription been filled recently? No  Is the patient out of the medication? Yes  Has the patient been seen for an appointment in the last year OR does the patient have an upcoming appointment? Yes  Can we respond through MyChart? Yes  Agent: Please be advised that Rx refills may take up to 3 business days. We ask that you follow-up with your pharmacy.

## 2024-04-18 NOTE — Telephone Encounter (Signed)
 Requesting: Vyvanse  40mg   Contract:11/23/23 UDS:11/23/23 Last Visit: 11/23/23 Next Visit: None Last Refill: 03/19/24 #30 and 0RF   Please Advise

## 2024-05-23 ENCOUNTER — Other Ambulatory Visit: Payer: Self-pay | Admitting: Family Medicine

## 2024-05-23 DIAGNOSIS — F988 Other specified behavioral and emotional disorders with onset usually occurring in childhood and adolescence: Secondary | ICD-10-CM

## 2024-05-23 NOTE — Telephone Encounter (Signed)
 Copied from CRM [id number]. Topic: Clinical - Medication Refill >> May 23, 2024  9:04 AM Pinkey ORN wrote: Medication: lisdexamfetamine (VYVANSE ) 40 MG capsule  Has the patient contacted their pharmacy? No (Agent: If no, request that the patient contact the pharmacy for the refill. If patient does not wish to contact the pharmacy document the reason why and proceed with request.) (Agent: If yes, when and what did the pharmacy advise?)  This is the patient's preferred pharmacy:  [facility] - [place], [place] - [address] [facility] [address] [place] [address] Phone: [phone] Fax: [phone]   Is this the correct pharmacy for this prescription? Yes If no, delete pharmacy and type the correct one.   Has the prescription been filled recently? No  Is the patient out of the medication? Yes  Has the patient been seen for an appointment in the last year OR does the patient have an upcoming appointment? Yes  Can we respond through MyChart? Yes  Agent: Please be advised that Rx refills may take up to 3 business days. We ask that you follow-up with your pharmacy.

## 2024-05-24 MED ORDER — LISDEXAMFETAMINE DIMESYLATE 40 MG PO CAPS: 40.0000 mg | ORAL_CAPSULE | Freq: Every morning | ORAL | 0 refills | Status: DC

## 2024-05-24 NOTE — Telephone Encounter (Signed)
 Requesting: Vyvanse  Contract: 11/23/2023 UDS: 11/23/2023 Last OV: 02/13/2024 Next OV: n/a Last Refill: 03/14/2024, #30--0 RF Database:   Please advise

## 2024-05-31 LAB — HM MAMMOGRAPHY

## 2024-06-03 ENCOUNTER — Encounter: Payer: Self-pay | Admitting: Radiology

## 2024-06-04 ENCOUNTER — Ambulatory Visit: Admitting: Orthopedic Surgery

## 2024-06-04 DIAGNOSIS — M25531 Pain in right wrist: Secondary | ICD-10-CM | POA: Diagnosis not present

## 2024-06-04 NOTE — Progress Notes (Signed)
 Beth Glover - 48 y.o. female MRN [id number]  Date of birth: 1976-04-17  Office Visit Note: Visit Date: 06/04/2024 PCP: Antonio Cyndee Jamee JONELLE, DO Referred by: Antonio Cyndee Jamee R, *  Subjective: No chief complaint on file.  HPI: Beth Glover is a pleasant 48 y.o. female who returns today for evaluation of ongoing right wrist pain after prior mechanical fall earlier this summer.  Pain along the radial aspect of the wrist and forearm has persisted with intermittent swelling.  She is also describing some numbness and tingling throughout the radial sided digits with associated nocturnal symptoms.  Pertinent ROS were reviewed with the patient and found to be negative unless otherwise specified above in HPI.    Assessment & Plan: Visit Diagnoses:  1. Right wrist pain      Plan: Based on her injury mechanism and workup, we were initially concerned that there could be an underlying scaphoid injury.  Fortunately, the CT scan has ruled out any scaphoid pathology at this time.   Currently, her signs and symptoms are more consistent with potential de Quervain's tenosynovitis versus carpal tunnel syndrome.  She is also demonstrating tightness and pain throughout the forearm region.  Given that her symptoms have been so persistent over the past multiple months and not responding to conservative treatments, we will perform right wrist MRI in order to better delineate any underlying soft tissue pathology not seen on the previous x-rays or CT scan.  As far as the numbness and tingling, recommended that she undergo right extremity EMG in the near future to better delineate potential carpal tunnel syndrome and to help guide treat modalities moving forward.  She expressed full understanding, will return to me after both studies are complete to review results and discuss appropriate next treatment steps.  I spent 30 minutes in the care of this patient today including review of  previous documentation, imaging obtained, face-to-face time discussing all options regarding treatment and documenting the encounter.   Follow-up: No follow-ups on file.   Meds & Orders: No orders of the defined types were placed in this encounter.   Orders Placed This Encounter  Procedures   MR Wrist Right w/o contrast   Ambulatory referral to Physical Medicine Rehab     Procedures: No procedures performed      Clinical History: No specialty comments available.  She reports that she has been smoking cigarettes. She has a 8.5 pack-year smoking history. She has never used smokeless tobacco. No results for input(s): HGBA1C, LABURIC in the last 8760 hours.  Objective:   Vital Signs: There were no vitals taken for this visit.  Physical Exam  Gen: Well-appearing, in no acute distress; non-toxic CV: Regular Rate. Well-perfused. Warm.  Resp: Breathing unlabored on room air; no wheezing. Psych: Fluid speech in conversation; appropriate affect; normal thought process  Ortho Exam General: Patient is well appearing and in no distress.   Skin and Muscle: No skin changes are apparent to upper extremities.  Muscle bulk and contour normal, no signs of atrophy.     Range of Motion and Palpation Tests: Mobility is full about the elbows with flexion and extension.  Forearm supination and pronation are 85/85 bilaterally.  Wrist flexion/extension is 75/65 bilaterally.  Digital flexion and extension are full.    Mild tenderness elicited over the radial styloid region and the scaphoid tubercle  No cords or nodules are palpated.  No triggering is observed.   Finklestein test is positive right side, moderate pain  with moderate swelling and tenderness.    Neurologic, Vascular, Motor: Positive Tinel's right carpal tunnel region, positive Phalen's, positive Durkan compression Sensation is intact to light touch in the median/radial/ulnar distributions.   Fingers pink and well perfused.   Capillary refill is brisk.     Imaging: No results found.  Past Medical/Family/Surgical/Social History: Medications & Allergies reviewed per EMR, new medications updated. Patient Active Problem List   Diagnosis Date Noted   Dysuria 01/20/2021   Attention deficit disorder (ADD) without hyperactivity 05/28/2020   Anxiety 04/30/2019   Hyperlipidemia 04/30/2019   Essential hypertension 12/18/2017   Syncope 12/18/2017   S/P cesarean section 01/02/2013   Normal pregnancy in multigravida 12/31/2012   Past Medical History:  Diagnosis Date   Abnormal Pap smear    Allergy    AMA (advanced maternal age) multigravida 35+    Bronchitis    Chicken pox    Chronic kidney disease    R ureteral reflux   GERD (gastroesophageal reflux disease)    H/O pre-eclampsia in prior pregnancy, currently pregnant    Heartburn    Hx of pyelonephritis    Hypertension    Normal pregnancy, repeat 12/31/2012   S/P cesarean section 01/02/2013   Ureteral reflux    right   Family History  Problem Relation Age of Onset   Cancer Mother    Miscarriages / Stillbirths Mother    Breast cancer Mother    Cancer Father    Diabetes Father    Diabetes Sister    Miscarriages / Stillbirths Sister    Depression Daughter    Heart disease Maternal Grandmother    Stroke Paternal Grandmother    Cancer Sister    Hypertension Sister    Early death Sister    Asthma Sister    Mental retardation Sister    Past Surgical History:  Procedure Laterality Date   BILATERAL SALPINGECTOMY Bilateral 01/02/2013   Procedure: BILATERAL SALPINGECTOMY;  Surgeon: Ezzie Marshall, MD;  Location: [facility];  Service: Obstetrics;  Laterality: Bilateral;   CESAREAN SECTION N/A 01/02/2013   Procedure: CESAREAN SECTION;  Surgeon: Ezzie Marshall, MD;  Location: [facility];  Service: Obstetrics;  Laterality: N/A;  Primary Cesarean Section Delivery Baby Boy @ 0410, Apgars    KIDNEY SURGERY     Correct R ureteral reflux, 2002   TONSILLECTOMY     Social  History   Occupational History   Not on file  Tobacco Use   Smoking status: Some Days    Current packs/day: 0.25    Average packs/day: 0.3 packs/day for 34.0 years (8.5 ttl pk-yrs)    Types: Cigarettes   Smokeless tobacco: Never   Tobacco comments:    down to 4-5 a day(12/18/17)  Substance and Sexual Activity   Alcohol use: Yes    Comment: Occas. since + UPT,  LAST TIME HAD MARIJUANA- SEPT.SABRA  LAST TIME HAD ALCOHOL- [place] EVE   Drug use: Not Currently    Comment: None since + UPT   Sexual activity: Yes    Partners: Male    Birth control/protection: Surgical    Pranavi Aure Estela) Arlinda, M.D. [facility], Hand Surgery

## 2024-06-06 ENCOUNTER — Encounter: Payer: Self-pay | Admitting: Family Medicine

## 2024-06-17 ENCOUNTER — Other Ambulatory Visit

## 2024-06-18 ENCOUNTER — Ambulatory Visit: Admitting: Orthopedic Surgery

## 2024-06-18 ENCOUNTER — Telehealth: Payer: Self-pay | Admitting: *Deleted

## 2024-06-18 NOTE — Telephone Encounter (Signed)
 Pt called left vm was concerned as to why imaging had cancelled her MRI, says they stated it was a problem with her insurance company. I explained to her that her insurance had not denied the imaging that it was in review.   Pt states she is now in excruciating pain with her wrist, woke up to tingling and numbness in her hand and her index finger was swollen and in pain and could not feel anything in her entire hand. Pt is having EMG done on Friday. Pt is wanting to have something done because she is in so much pain.

## 2024-06-21 ENCOUNTER — Ambulatory Visit: Admitting: Physical Medicine and Rehabilitation

## 2024-06-21 DIAGNOSIS — M25531 Pain in right wrist: Secondary | ICD-10-CM | POA: Diagnosis not present

## 2024-06-21 DIAGNOSIS — M7989 Other specified soft tissue disorders: Secondary | ICD-10-CM | POA: Diagnosis not present

## 2024-06-21 DIAGNOSIS — R202 Paresthesia of skin: Secondary | ICD-10-CM

## 2024-06-21 DIAGNOSIS — R29898 Other symptoms and signs involving the musculoskeletal system: Secondary | ICD-10-CM | POA: Diagnosis not present

## 2024-06-21 NOTE — Progress Notes (Unsigned)
 Pain Scale   Average Pain 4 Patient advising she has right hand pain, with numbness and tingling also some swelling. Patient is right hand dominate        +Driver, -BT, -Dye Allergies.

## 2024-06-24 NOTE — Telephone Encounter (Signed)
 Pt had EMG done on Friday, I checked to see if notes were in. Nothing at this time.

## 2024-06-25 ENCOUNTER — Encounter: Payer: Self-pay | Admitting: Physical Medicine and Rehabilitation

## 2024-06-25 NOTE — Progress Notes (Signed)
 Beth Glover - 48 y.o. female MRN [id number]  Date of birth: 01-09-1976  Office Visit Note: Visit Date: 06/21/2024 PCP: Antonio Cyndee Jamee JONELLE, DO Referred by: Arlinda Buster, MD  Subjective: Chief Complaint  Patient presents with   Right Hand - Pain, Numbness, Weakness   HPI: Beth Glover is a 48 y.o. female who comes in today at the request of Dr. Anshul Agarwala for evaluation and management of chronic, worsening and severe  numbness and tingling in the Right upper extremities.  Patient is Right hand dominant.  She reports mostly tingling and numbness in the somewhat radial side of the hand but can be global.  This is after mechanical fall this summer.  She has had some wrist pain but not a lot of pain now.  She does get some swelling in essentially what sounds almost like allodynia.  Very sensitive to touch.  She has noticed some swelling temperature change.  No history of cervical spine problems and no frank radicular symptoms.  She is not diabetic.  She has tried conservative care with medications excetra without relief.  CT scan of the hand was unrevealing.   I spent more than 30 minutes speaking face-to-face with the patient with 50% of the time in counseling and discussing coordination of care.      Review of Systems  Musculoskeletal:  Positive for joint pain.  Neurological:  Positive for tingling and weakness.  All other systems reviewed and are negative.  Otherwise per HPI.  Assessment & Plan: Visit Diagnoses:    ICD-10-CM   1. Paresthesia of skin  R20.2 NCV with EMG (electromyography)    2. Right wrist pain  M25.531     3. Right hand weakness  R29.898     4. Swelling of right hand  M79.89        Plan: Clinically could be an underlying carpal tunnel syndrome of some significance along with musculoskeletal tendinitis and maybe even underlying CRPS although not really fitting the diagnostic criteria.  Electrodiagnostic study performed  today.  Meds & Orders: No orders of the defined types were placed in this encounter.   Orders Placed This Encounter  Procedures   NCV with EMG (electromyography)    Follow-up: Return for Anshul Agarwala, MD.   Procedures: No procedures performed  EMG & NCV Findings: All nerve conduction studies (as indicated in the following tables) were within normal limits.    All examined muscles (as indicated in the following table) showed no evidence of electrical instability.    Impression: Essentially NORMAL electrodiagnostic study of the right upper limb.  There is no significant electrodiagnostic evidence of nerve entrapment, brachial plexopathy or cervical radiculopathy.  Clinically has some features of a complex regional pain syndrome but does not meet full [place] criteria.  As you know, purely sensory or demyelinating radiculopathies and chemical radiculitis may not be detected with this particular electrodiagnostic study.   **This electrodiagnostic study cannot rule out small fiber polyneuropathy and dysesthesias from central pain syndromes such as stroke or central pain sensitization syndromes such as fibromyalgia.  Myotomal referral pain from trigger points is also not excluded.  Recommendations: 1.  Follow-up with referring physician. 2.  Continue current management of symptoms.  Occupational therapy with desensitizing therapy and strengthening if not already done.  ___________________________ Prentice Eldonna BETTERS Board Certified, American Board of Physical Medicine and Rehabilitation    Nerve Conduction Studies Anti Sensory Summary Table   Stim Site NR Peak (ms) Norm Peak (ms) P-T  Amp (V) Norm P-T Amp Site1 Site2 Delta-P (ms) Dist (cm) Vel (m/s) Norm Vel (m/s)  Right Median Acr Palm Anti Sensory (2nd Digit)  31.3C  Wrist    2.8 <3.6 79.0 >10 Wrist Palm 1.1 0.0    Palm    1.7 <2.0 101.3         Right Radial Anti Sensory (Base 1st Digit)  30.6C  Wrist    2.1 <3.1 50.6   Wrist Base 1st Digit 2.1 0.0    Right Ulnar Anti Sensory (5th Digit)  31.4C  Wrist    3.0 <3.7 46.9 >15.0 Wrist 5th Digit 3.0 14.0 47 >38   Motor Summary Table   Stim Site NR Onset (ms) Norm Onset (ms) O-P Amp (mV) Norm O-P Amp Site1 Site2 Delta-0 (ms) Dist (cm) Vel (m/s) Norm Vel (m/s)  Right Median Motor (Abd Poll Brev)  31C  Wrist    2.8 <4.2 8.8 >5 Elbow Wrist 3.4 18.0 53 >50  Elbow    6.2  8.6         Right Ulnar Motor (Abd Dig Min)  31C  Wrist    3.0 <4.2 10.3 >3 B Elbow Wrist 2.6 17.0 65 >53  B Elbow    5.6  10.1  A Elbow B Elbow 1.2 10.0 83 >53  A Elbow    6.8  9.4          EMG   Side Muscle Nerve Root Ins Act Fibs Psw Amp Dur Poly Recrt Int Bruna Comment  Right Abd Poll Brev Median C8-T1 Nml Nml Nml Nml Nml 0 Nml Nml   Right 1stDorInt Ulnar C8-T1 Nml Nml Nml Nml Nml 0 Nml Nml   Right PronatorTeres Median C6-7 Nml Nml Nml Nml Nml 0 Nml Nml   Right Biceps Musculocut C5-6 Nml Nml Nml Nml Nml 0 Nml Nml   Right Deltoid Axillary C5-6 Nml Nml Nml Nml Nml 0 Nml Nml     Nerve Conduction Studies Anti Sensory Left/Right Comparison   Stim Site L Lat (ms) R Lat (ms) L-R Lat (ms) L Amp (V) R Amp (V) L-R Amp (%) Site1 Site2 L Vel (m/s) R Vel (m/s) L-R Vel (m/s)  Median Acr Palm Anti Sensory (2nd Digit)  31.3C  Wrist  2.8   79.0  Wrist Palm     Palm  1.7   101.3        Radial Anti Sensory (Base 1st Digit)  30.6C  Wrist  2.1   50.6  Wrist Base 1st Digit     Ulnar Anti Sensory (5th Digit)  31.4C  Wrist  3.0   46.9  Wrist 5th Digit  47    Motor Left/Right Comparison   Stim Site L Lat (ms) R Lat (ms) L-R Lat (ms) L Amp (mV) R Amp (mV) L-R Amp (%) Site1 Site2 L Vel (m/s) R Vel (m/s) L-R Vel (m/s)  Median Motor (Abd Poll Brev)  31C  Wrist  2.8   8.8  Elbow Wrist  53   Elbow  6.2   8.6        Ulnar Motor (Abd Dig Min)  31C  Wrist  3.0   10.3  B Elbow Wrist  65   B Elbow  5.6   10.1  A Elbow B Elbow  83   A Elbow  6.8   9.4           Waveforms:            Clinical  History:  No specialty comments available.   She reports that she has been smoking cigarettes. She has a 8.5 pack-year smoking history. She has never used smokeless tobacco. No results for input(s): HGBA1C, LABURIC in the last 8760 hours.  Objective:  VS:  HT:    WT:   BMI:     BP:   HR: bpm  TEMP: ( )  RESP:  Physical Exam Vitals and nursing note reviewed.  Constitutional:      General: She is not in acute distress.    Appearance: Normal appearance. She is well-developed. She is not ill-appearing.  HENT:     Head: Normocephalic and atraumatic.  Eyes:     Conjunctiva/sclera: Conjunctivae normal.     Pupils: Pupils are equal, round, and reactive to light.  Cardiovascular:     Rate and Rhythm: Normal rate.     Pulses: Normal pulses.  Pulmonary:     Effort: Pulmonary effort is normal.  Musculoskeletal:        General: Tenderness present. No swelling or deformity.     Right lower leg: No edema.     Left lower leg: No edema.     Comments: Inspection reveals no atrophy of the bilateral APB or FDI or hand intrinsics. There is no swelling, color changes, allodynia or dystrophic changes. There is 5 out of 5 strength in the bilateral wrist extension, finger abduction and long finger flexion. There is intact sensation to light touch in all dermatomal and peripheral nerve distributions. Arno is a negative Tinel's test at the bilateral wrist and elbow. There is a negative Phalen's test bilaterally. There is a negative Hoffmann's test bilaterally.  Skin:    General: Skin is warm and dry.     Findings: No erythema or rash.  Neurological:     General: No focal deficit present.     Mental Status: She is alert and oriented to person, place, and time.     Cranial Nerves: No cranial nerve deficit.     Sensory: No sensory deficit.     Motor: No weakness or abnormal muscle tone.     Coordination: Coordination normal.     Gait: Gait normal.  Psychiatric:        Mood and Affect: Mood normal.         Behavior: Behavior normal.     Ortho Exam  Imaging: No results found.  Past Medical/Family/Surgical/Social History: Medications & Allergies reviewed per EMR, new medications updated. Patient Active Problem List   Diagnosis Date Noted   Dysuria 01/20/2021   Attention deficit disorder (ADD) without hyperactivity 05/28/2020   Anxiety 04/30/2019   Hyperlipidemia 04/30/2019   Essential hypertension 12/18/2017   Syncope 12/18/2017   S/P cesarean section 01/02/2013   Normal pregnancy in multigravida 12/31/2012   Past Medical History:  Diagnosis Date   Abnormal Pap smear    Allergy    AMA (advanced maternal age) multigravida 35+    Bronchitis    Chicken pox    Chronic kidney disease    R ureteral reflux   GERD (gastroesophageal reflux disease)    H/O pre-eclampsia in prior pregnancy, currently pregnant    Heartburn    Hx of pyelonephritis    Hypertension    Normal pregnancy, repeat 12/31/2012   S/P cesarean section 01/02/2013   Ureteral reflux    right   Family History  Problem Relation Age of Onset   Cancer Mother    Miscarriages / Stillbirths Mother    Breast cancer Mother  Cancer Father    Diabetes Father    Diabetes Sister    Miscarriages / Stillbirths Sister    Depression Daughter    Heart disease Maternal Grandmother    Stroke Paternal Grandmother    Cancer Sister    Hypertension Sister    Early death Sister    Asthma Sister    Mental retardation Sister    Past Surgical History:  Procedure Laterality Date   BILATERAL SALPINGECTOMY Bilateral 01/02/2013   Procedure: BILATERAL SALPINGECTOMY;  Surgeon: Ezzie Marshall, MD;  Location: [facility];  Service: Obstetrics;  Laterality: Bilateral;   CESAREAN SECTION N/A 01/02/2013   Procedure: CESAREAN SECTION;  Surgeon: Ezzie Marshall, MD;  Location: [facility];  Service: Obstetrics;  Laterality: N/A;  Primary Cesarean Section Delivery Baby Boy @ 0410, Apgars    KIDNEY SURGERY     Correct R ureteral reflux, 2002    TONSILLECTOMY     Social History   Occupational History   Not on file  Tobacco Use   Smoking status: Some Days    Current packs/day: 0.25    Average packs/day: 0.3 packs/day for 34.0 years (8.5 ttl pk-yrs)    Types: Cigarettes   Smokeless tobacco: Never   Tobacco comments:    down to 4-5 a day(12/18/17)  Substance and Sexual Activity   Alcohol use: Yes    Comment: Occas. since + UPT,  LAST TIME HAD MARIJUANA- SEPT.SABRA  LAST TIME HAD ALCOHOL- [place] EVE   Drug use: Not Currently    Comment: None since + UPT   Sexual activity: Yes    Partners: Male    Birth control/protection: Surgical

## 2024-06-25 NOTE — Procedures (Signed)
 EMG & NCV Findings: All nerve conduction studies (as indicated in the following tables) were within normal limits.    All examined muscles (as indicated in the following table) showed no evidence of electrical instability.    Impression: Essentially NORMAL electrodiagnostic study of the right upper limb.  There is no significant electrodiagnostic evidence of nerve entrapment, brachial plexopathy or cervical radiculopathy.  Clinically has some features of a complex regional pain syndrome but does not meet full [place] criteria.  As you know, purely sensory or demyelinating radiculopathies and chemical radiculitis may not be detected with this particular electrodiagnostic study.   **This electrodiagnostic study cannot rule out small fiber polyneuropathy and dysesthesias from central pain syndromes such as stroke or central pain sensitization syndromes such as fibromyalgia.  Myotomal referral pain from trigger points is also not excluded.  Recommendations: 1.  Follow-up with referring physician. 2.  Continue current management of symptoms.  Occupational therapy with desensitizing therapy and strengthening if not already done.  ___________________________ Prentice Eldonna BETTERS Board Certified, American Board of Physical Medicine and Rehabilitation    Nerve Conduction Studies Anti Sensory Summary Table   Stim Site NR Peak (ms) Norm Peak (ms) P-T Amp (V) Norm P-T Amp Site1 Site2 Delta-P (ms) Dist (cm) Vel (m/s) Norm Vel (m/s)  Right Median Acr Palm Anti Sensory (2nd Digit)  31.3C  Wrist    2.8 <3.6 79.0 >10 Wrist Palm 1.1 0.0    Palm    1.7 <2.0 101.3         Right Radial Anti Sensory (Base 1st Digit)  30.6C  Wrist    2.1 <3.1 50.6  Wrist Base 1st Digit 2.1 0.0    Right Ulnar Anti Sensory (5th Digit)  31.4C  Wrist    3.0 <3.7 46.9 >15.0 Wrist 5th Digit 3.0 14.0 47 >38   Motor Summary Table   Stim Site NR Onset (ms) Norm Onset (ms) O-P Amp (mV) Norm O-P Amp Site1 Site2 Delta-0 (ms)  Dist (cm) Vel (m/s) Norm Vel (m/s)  Right Median Motor (Abd Poll Brev)  31C  Wrist    2.8 <4.2 8.8 >5 Elbow Wrist 3.4 18.0 53 >50  Elbow    6.2  8.6         Right Ulnar Motor (Abd Dig Min)  31C  Wrist    3.0 <4.2 10.3 >3 B Elbow Wrist 2.6 17.0 65 >53  B Elbow    5.6  10.1  A Elbow B Elbow 1.2 10.0 83 >53  A Elbow    6.8  9.4          EMG   Side Muscle Nerve Root Ins Act Fibs Psw Amp Dur Poly Recrt Int Bruna Comment  Right Abd Poll Brev Median C8-T1 Nml Nml Nml Nml Nml 0 Nml Nml   Right 1stDorInt Ulnar C8-T1 Nml Nml Nml Nml Nml 0 Nml Nml   Right PronatorTeres Median C6-7 Nml Nml Nml Nml Nml 0 Nml Nml   Right Biceps Musculocut C5-6 Nml Nml Nml Nml Nml 0 Nml Nml   Right Deltoid Axillary C5-6 Nml Nml Nml Nml Nml 0 Nml Nml     Nerve Conduction Studies Anti Sensory Left/Right Comparison   Stim Site L Lat (ms) R Lat (ms) L-R Lat (ms) L Amp (V) R Amp (V) L-R Amp (%) Site1 Site2 L Vel (m/s) R Vel (m/s) L-R Vel (m/s)  Median Acr Palm Anti Sensory (2nd Digit)  31.3C  Wrist  2.8   79.0  Wrist Palm  Palm  1.7   101.3        Radial Anti Sensory (Base 1st Digit)  30.6C  Wrist  2.1   50.6  Wrist Base 1st Digit     Ulnar Anti Sensory (5th Digit)  31.4C  Wrist  3.0   46.9  Wrist 5th Digit  47    Motor Left/Right Comparison   Stim Site L Lat (ms) R Lat (ms) L-R Lat (ms) L Amp (mV) R Amp (mV) L-R Amp (%) Site1 Site2 L Vel (m/s) R Vel (m/s) L-R Vel (m/s)  Median Motor (Abd Poll Brev)  31C  Wrist  2.8   8.8  Elbow Wrist  53   Elbow  6.2   8.6        Ulnar Motor (Abd Dig Min)  31C  Wrist  3.0   10.3  B Elbow Wrist  65   B Elbow  5.6   10.1  A Elbow B Elbow  83   A Elbow  6.8   9.4           Waveforms:

## 2024-06-26 NOTE — Telephone Encounter (Signed)
 Faxed EMG to [facility] at [phone] with case # [id number]

## 2024-07-01 ENCOUNTER — Telehealth: Payer: Self-pay | Admitting: Family Medicine

## 2024-07-01 DIAGNOSIS — F988 Other specified behavioral and emotional disorders with onset usually occurring in childhood and adolescence: Secondary | ICD-10-CM

## 2024-07-01 NOTE — Telephone Encounter (Unsigned)
 Copied from CRM #[id number]. Topic: Clinical - Medication Refill >> Jul 01, 2024  2:46 PM Lauren C wrote: Medication: lisdexamfetamine (VYVANSE ) 40 MG capsule  Has the patient contacted their pharmacy? No (Agent: If no, request that the patient contact the pharmacy for the refill. If patient does not wish to contact the pharmacy document the reason why and proceed with request.) (Agent: If yes, when and what did the pharmacy advise?)  This is the patient's preferred pharmacy:  [facility] - [place], [place] - [address] [facility] [address] [place] [address] Phone: [phone] Fax: [phone]  Is this the correct pharmacy for this prescription? Yes If no, delete pharmacy and type the correct one.   Has the prescription been filled recently? Yes  Is the patient out of the medication? No, 2 pills left  Has the patient been seen for an appointment in the last year OR does the patient have an upcoming appointment? Yes, last seen 7/15  Can we respond through MyChart? Yes  Agent: Please be advised that Rx refills may take up to 3 business days. We ask that you follow-up with your pharmacy.

## 2024-07-02 MED ORDER — LISDEXAMFETAMINE DIMESYLATE 40 MG PO CAPS: 40.0000 mg | ORAL_CAPSULE | Freq: Every morning | ORAL | 0 refills | Status: DC

## 2024-07-02 NOTE — Telephone Encounter (Signed)
 Got her scheduled

## 2024-07-02 NOTE — Telephone Encounter (Signed)
 Requesting: Vyvanse  40mg   Contract:11/23/23 UDS:11/23/23 Last Visit: 11/23/23 Next Visit:07/16/24 Last Refill: 05/24/24 #30 and 0RF   Please Advise

## 2024-07-02 NOTE — Telephone Encounter (Signed)
 Last OV w/ PCP was in April, needs f/u please.

## 2024-07-03 ENCOUNTER — Telehealth: Payer: Self-pay | Admitting: Orthopedic Surgery

## 2024-07-05 ENCOUNTER — Telehealth: Payer: Self-pay | Admitting: Orthopedic Surgery

## 2024-07-05 NOTE — Telephone Encounter (Signed)
 Patient called and said [facility]win put in for an MRI but her insurance didn't cover it and she needs to know what's next. CB#[phone]

## 2024-07-05 NOTE — Telephone Encounter (Signed)
 Refaxed EMG and last ov to [facility] for reconsideraton

## 2024-07-08 ENCOUNTER — Ambulatory Visit
Admission: RE | Admit: 2024-07-08 | Discharge: 2024-07-08 | Disposition: A | Source: Ambulatory Visit | Attending: Orthopedic Surgery

## 2024-07-08 DIAGNOSIS — M25531 Pain in right wrist: Secondary | ICD-10-CM

## 2024-07-16 ENCOUNTER — Ambulatory Visit: Admitting: Family Medicine

## 2024-07-16 VITALS — BP 120/80 | HR 73 | Temp 97.9°F | Resp 16 | Ht 62.5 in | Wt 137.8 lb

## 2024-07-16 DIAGNOSIS — E782 Mixed hyperlipidemia: Secondary | ICD-10-CM

## 2024-07-16 DIAGNOSIS — F988 Other specified behavioral and emotional disorders with onset usually occurring in childhood and adolescence: Secondary | ICD-10-CM

## 2024-07-16 DIAGNOSIS — S66911D Strain of unspecified muscle, fascia and tendon at wrist and hand level, right hand, subsequent encounter: Secondary | ICD-10-CM | POA: Diagnosis not present

## 2024-07-16 DIAGNOSIS — Z124 Encounter for screening for malignant neoplasm of cervix: Secondary | ICD-10-CM | POA: Insufficient documentation

## 2024-07-16 DIAGNOSIS — Z1211 Encounter for screening for malignant neoplasm of colon: Secondary | ICD-10-CM

## 2024-07-16 MED ORDER — LISDEXAMFETAMINE DIMESYLATE 40 MG PO CAPS: 40.0000 mg | ORAL_CAPSULE | ORAL | 0 refills | Status: AC

## 2024-07-16 MED ORDER — PREDNISONE 10 MG PO TABS: ORAL_TABLET | ORAL | 0 refills | Status: AC

## 2024-07-16 MED ORDER — LISDEXAMFETAMINE DIMESYLATE 40 MG PO CAPS: 40.0000 mg | ORAL_CAPSULE | Freq: Every morning | ORAL | 0 refills | Status: AC

## 2024-07-16 NOTE — Progress Notes (Signed)
 Subjective:    Patient ID: Beth Glover, female    DOB: August 21, 1975, 48 y.o.   MRN: [id number]  Chief Complaint  Patient presents with   ADD   Follow-up    HPI Patient is in today for r wrist pain and add f/u.  Discussed the use of AI scribe software for clinical note transcription with the patient, who gave verbal consent to proceed.  History of Present Illness Beth Glover is a 48 year old female who presents with persistent wrist pain and swelling following a fall five months ago.  She has been experiencing ongoing pain and swelling in her wrist for the past five months following a fall during the Fourth of July weekend. Initially, she thought it was a minor sprain and waited ten days before seeking medical attention. An x-ray was performed, which showed no fractures, and she was referred to an orthopedic specialist.  The orthopedic specialist conducted a CT scan suspecting a scaphoid fracture, which was negative. Despite this, her symptoms have persisted and worsened over time, leading to an MRI being performed recently. She reports that her MRI showed 'edema like signal changes,' but she has not yet discussed the results with her orthopedic doctor.  She experiences loss of feeling in her hand, particularly in the middle of the day, and describes 'pins and needles' sensations. Swelling increases by the end of the day, affecting her entire hand and sometimes causing her fingers to swell significantly, impacting her ability to perform daily activities such as driving and typing.  She has tried various treatments including splinting, icing, and using Voltaren cream, which provides some relief. Heat application worsened her symptoms. She also takes ibuprofen  regularly for pain management. A nerve conduction study was performed, which did not show any nerve issues at the time.  She mentions using voice assist at work to minimize typing due to the pain in her wrist. She  is currently using Voltaren cream and ibuprofen  for pain management. She also has a prescription for Adderall, which she does not need refilled at this time.    Past Medical History:  Diagnosis Date   Abnormal Pap smear    Allergy    AMA (advanced maternal age) multigravida 35+    Bronchitis    Chicken pox    Chronic kidney disease    R ureteral reflux   GERD (gastroesophageal reflux disease)    H/O pre-eclampsia in prior pregnancy, currently pregnant    Heartburn    Hx of pyelonephritis    Hypertension    Normal pregnancy, repeat 12/31/2012   S/P cesarean section 01/02/2013   Ureteral reflux    right    Past Surgical History:  Procedure Laterality Date   BILATERAL SALPINGECTOMY Bilateral 01/02/2013   Procedure: BILATERAL SALPINGECTOMY;  Surgeon: Ezzie Marshall, MD;  Location: [facility];  Service: Obstetrics;  Laterality: Bilateral;   CESAREAN SECTION N/A 01/02/2013   Procedure: CESAREAN SECTION;  Surgeon: Ezzie Marshall, MD;  Location: [facility];  Service: Obstetrics;  Laterality: N/A;  Primary Cesarean Section Delivery Baby Boy @ 0410, Apgars    KIDNEY SURGERY     Correct R ureteral reflux, 2002   TONSILLECTOMY      Family History  Problem Relation Age of Onset   Cancer Mother    Miscarriages / Stillbirths Mother    Breast cancer Mother    Cancer Father    Diabetes Father    Diabetes Sister    Miscarriages / Stillbirths Sister  Depression Daughter    Heart disease Maternal Grandmother    Stroke Paternal Grandmother    Cancer Sister    Hypertension Sister    Early death Sister    Asthma Sister    Mental retardation Sister     Social History   Socioeconomic History   Marital status: Significant Other    Spouse name: Not on file   Number of children: Not on file   Years of education: Not on file   Highest education level: GED or equivalent  Occupational History   Not on file  Tobacco Use   Smoking status: Some Days    Current packs/day: 0.25    Average packs/day: 0.3  packs/day for 34.0 years (8.5 ttl pk-yrs)    Types: Cigarettes   Smokeless tobacco: Never   Tobacco comments:    down to 4-5 a day(12/18/17)  Substance and Sexual Activity   Alcohol use: Yes    Comment: Occas. since + UPT,  LAST TIME HAD MARIJUANA- SEPT.SABRA  LAST TIME HAD ALCOHOL- [place] EVE   Drug use: Not Currently    Comment: None since + UPT   Sexual activity: Yes    Partners: Male    Birth control/protection: Surgical  Other Topics Concern   Not on file  Social History Narrative   Exercise --- no    Social Drivers of Health   Tobacco Use: High Risk (07/16/2024)   Patient History    Smoking Tobacco Use: Some Days    Smokeless Tobacco Use: Never    Passive Exposure: Not on file  Financial Resource Strain: Low Risk (07/16/2024)   Overall Financial Resource Strain (CARDIA)    Difficulty of Paying Living Expenses: Not very hard  Food Insecurity: No Food Insecurity (07/16/2024)   Epic    Worried About Radiation Protection Practitioner of Food in the Last Year: Never true    Ran Out of Food in the Last Year: Never true  Transportation Needs: No Transportation Needs (07/16/2024)   Epic    Lack of Transportation (Medical): No    Lack of Transportation (Non-Medical): No  Physical Activity: Inactive (07/16/2024)   Exercise Vital Sign    Days of Exercise per Week: 0 days    Minutes of Exercise per Session: Not on file  Stress: Stress Concern Present (07/16/2024)   [employer] of Occupational Health - Occupational Stress Questionnaire    Feeling of Stress: To some extent  Social Connections: Moderately Isolated (07/16/2024)   Social Connection and Isolation Panel    Frequency of Communication with Friends and Family: Twice a week    Frequency of Social Gatherings with Friends and Family: Once a week    Attends Religious Services: Never    Database Administrator or Organizations: No    Attends Engineer, Structural: Not on file    Marital Status: Living with partner  Intimate Partner  Violence: Not on file  Depression (PHQ2-9): Low Risk (07/16/2024)   Depression (PHQ2-9)    PHQ-2 Score: 0  Alcohol Screen: Low Risk (07/16/2024)   Alcohol Screen    Last Alcohol Screening Score (AUDIT): 2  Housing: Unknown (07/16/2024)   Epic    Unable to Pay for Housing in the Last Year: No    Number of Times Moved in the Last Year: Not on file    Homeless in the Last Year: No  Utilities: Not on file  Health Literacy: Not on file    Outpatient Medications Prior to Visit  Medication Sig Dispense  Refill   albuterol (VENTOLIN HFA) 108 (90 Base) MCG/ACT inhaler Inhale 1 puff into the lungs.     cetirizine (ZYRTEC) 10 MG tablet Take 10 mg by mouth daily.     lisdexamfetamine  (VYVANSE ) 40 MG capsule Take 1 capsule (40 mg total) by mouth every morning. 30 capsule 0   lisdexamfetamine  (VYVANSE ) 40 MG capsule Take 1 capsule (40 mg total) by mouth every morning. 30 capsule 0   No facility-administered medications prior to visit.    Allergies[1]  Review of Systems  Constitutional:  Negative for fever and malaise/fatigue.  HENT:  Negative for congestion.   Eyes:  Negative for blurred vision.  Respiratory:  Negative for shortness of breath.   Cardiovascular:  Negative for chest pain, palpitations and leg swelling.  Gastrointestinal:  Negative for abdominal pain, blood in stool and nausea.  Genitourinary:  Negative for dysuria and frequency.  Musculoskeletal:  Positive for joint pain. Negative for falls.  Skin:  Negative for rash.  Neurological:  Negative for dizziness, loss of consciousness and headaches.  Endo/Heme/Allergies:  Negative for environmental allergies.  Psychiatric/Behavioral:  Negative for depression. The patient is not nervous/anxious.        Objective:    Physical Exam Vitals and nursing note reviewed.  Constitutional:      General: She is not in acute distress.    Appearance: Normal appearance. She is well-developed.  HENT:     Head: Normocephalic and  atraumatic.  Eyes:     General: No scleral icterus.       Right eye: No discharge.        Left eye: No discharge.  Cardiovascular:     Rate and Rhythm: Normal rate and regular rhythm.     Heart sounds: No murmur heard. Pulmonary:     Effort: Pulmonary effort is normal. No respiratory distress.     Breath sounds: Normal breath sounds.  Musculoskeletal:        General: Swelling present.     Right forearm: Swelling and tenderness present.     Right wrist: Swelling and tenderness present. Decreased range of motion.       Arms:     Cervical back: Normal range of motion and neck supple.     Right lower leg: No edema.     Left lower leg: No edema.  Skin:    General: Skin is warm and dry.  Neurological:     Mental Status: She is alert and oriented to person, place, and time.  Psychiatric:        Mood and Affect: Mood normal.        Behavior: Behavior normal.        Thought Content: Thought content normal.        Judgment: Judgment normal.     BP 120/80 (BP Location: Left Arm, Patient Position: Sitting, Cuff Size: Normal)   Pulse 73   Temp 97.9 F (36.6 C) (Oral)   Resp 16   Ht 5' 2.5 (1.588 m)   Wt 137 lb 12.8 oz (62.5 kg)   SpO2 99%   BMI 24.80 kg/m  Wt Readings from Last 3 Encounters:  07/16/24 137 lb 12.8 oz (62.5 kg)  11/23/23 141 lb 12.8 oz (64.3 kg)  11/24/22 141 lb (64 kg)    Diabetic Foot Exam - Simple   No data filed    Lab Results  Component Value Date   WBC 7.7 11/24/2022   HGB 14.4 11/24/2022   HCT 43.4 11/24/2022   PLT  323.0 11/24/2022   GLUCOSE 81 11/24/2022   CHOL 250 (H) 11/24/2022   TRIG 156.0 (H) 11/24/2022   HDL 76.00 11/24/2022   LDLCALC 142 (H) 11/24/2022   ALT 13 11/24/2022   AST 13 11/24/2022   NA 138 11/24/2022   K 4.7 11/24/2022   CL 101 11/24/2022   CREATININE 0.71 11/24/2022   BUN 11 11/24/2022   CO2 28 11/24/2022   TSH 2.00 11/24/2022   HGBA1C 5.9 05/09/2019    Lab Results  Component Value Date   TSH 2.00 11/24/2022    Lab Results  Component Value Date   WBC 7.7 11/24/2022   HGB 14.4 11/24/2022   HCT 43.4 11/24/2022   MCV 89.2 11/24/2022   PLT 323.0 11/24/2022   Lab Results  Component Value Date   NA 138 11/24/2022   K 4.7 11/24/2022   CO2 28 11/24/2022   GLUCOSE 81 11/24/2022   BUN 11 11/24/2022   CREATININE 0.71 11/24/2022   BILITOT 0.5 11/24/2022   ALKPHOS 63 11/24/2022   AST 13 11/24/2022   ALT 13 11/24/2022   PROT 7.7 11/24/2022   ALBUMIN 4.4 11/24/2022   CALCIUM  10.1 11/24/2022   GFR 100.94 11/24/2022   Lab Results  Component Value Date   CHOL 250 (H) 11/24/2022   Lab Results  Component Value Date   HDL 76.00 11/24/2022   Lab Results  Component Value Date   LDLCALC 142 (H) 11/24/2022   Lab Results  Component Value Date   TRIG 156.0 (H) 11/24/2022   Lab Results  Component Value Date   CHOLHDL 3 11/24/2022   Lab Results  Component Value Date   HGBA1C 5.9 05/09/2019       Assessment & Plan:  Wrist strain, right, subsequent encounter -     predniSONE ; TAKE 3 TABLETS PO QD FOR 3 DAYS THEN TAKE 2 TABLETS PO QD FOR 3 DAYS THEN TAKE 1 TABLET PO QD FOR 3 DAYS THEN TAKE 1/2 TAB PO QD FOR 3 DAYS  Dispense: 20 tablet; Refill: 0  Attention deficit disorder (ADD) in adult -     Lisdexamfetamine  Dimesylate; Take 1 capsule (40 mg total) by mouth every morning.  Dispense: 30 capsule; Refill: 0 -     Lisdexamfetamine  Dimesylate; Take 1 capsule (40 mg total) by mouth every morning.  Dispense: 30 capsule; Refill: 0 -     Lisdexamfetamine  Dimesylate; Take 1 capsule (40 mg total) by mouth every morning.  Dispense: 30 capsule; Refill: 0  Mixed hyperlipidemia  Colon cancer screening -     Ambulatory referral to Gastroenterology  Cervical cancer screening  Attention deficit disorder (ADD) without hyperactivity Assessment & Plan: Stable  Con't vyvanse    Assessment and Plan Assessment & Plan Chronic right wrist pain and sensory changes   She has experienced persistent  right wrist pain and sensory changes for five months following a fall on July 4th weekend. Initial imaging, including x-ray and CT, showed no fractures, while MRI revealed edema-like signal changes that may be normal. A nerve conduction study indicated no nerve issues. Symptoms include swelling, pain, and sensory changes such as pins and needles and loss of sensation, particularly by the end of the day. Differential diagnosis includes muscle strain and overuse syndrome. Current management with Voltaren cream and ibuprofen  provides limited relief. Heat exacerbates symptoms, while ice offers some relief. A compression brace is not beneficial and may worsen pain. Prescribe a prednisone  taper to assess the potential benefit of a cortisone injection. Advise follow-up with an  orthopedic specialist for further evaluation and management. Continue using Voltaren cream and ibuprofen  as needed for pain management.  General health maintenance   She is due for a cholesterol check, colonoscopy, tetanus vaccination, and Pap smear. A mammogram was completed at work. Schedule a cholesterol check. Plan for a colonoscopy, considering scheduling constraints. Plan for a tetanus vaccination. Schedule a Pap smear during the next physical examination.    Beth JONELLE Shanks Chase, DO     [1]  Allergies Allergen Reactions   Amoxicillin Hives, Shortness Of Breath and Swelling    Can't take any cilllins   Penicillins Hives, Shortness Of Breath and Swelling   Corn-Containing Products    Lisinopril Other (See Comments)    Throat itching   Amlodipine Palpitations

## 2024-07-16 NOTE — Assessment & Plan Note (Signed)
Stable Con't vyvanse

## 2024-07-31 ENCOUNTER — Ambulatory Visit: Admitting: Sports Medicine

## 2024-07-31 ENCOUNTER — Encounter: Payer: Self-pay | Admitting: Sports Medicine

## 2024-07-31 ENCOUNTER — Ambulatory Visit: Payer: Self-pay

## 2024-07-31 VITALS — BP 136/89 | HR 74 | Temp 97.6°F | Wt 137.8 lb

## 2024-07-31 DIAGNOSIS — N3000 Acute cystitis without hematuria: Secondary | ICD-10-CM | POA: Diagnosis not present

## 2024-07-31 DIAGNOSIS — R350 Frequency of micturition: Secondary | ICD-10-CM | POA: Diagnosis not present

## 2024-07-31 LAB — POC URINALSYSI DIPSTICK (AUTOMATED)
Glucose, UA: POSITIVE — AB
Ketones, UA: NEGATIVE
Nitrite, UA: POSITIVE
Protein, UA: POSITIVE — AB
Spec Grav, UA: 1.025
Urobilinogen, UA: 2 U/dL — AB
pH, UA: 6

## 2024-07-31 MED ORDER — NITROFURANTOIN MONOHYD MACRO 100 MG PO CAPS: 100.0000 mg | ORAL_CAPSULE | Freq: Two times a day (BID) | ORAL | 0 refills | Status: AC

## 2024-07-31 NOTE — Telephone Encounter (Signed)
 FYI Only or Action Required?: FYI only for provider: appointment scheduled on 12/31.  Patient was last seen in primary care on 07/16/2024 by Antonio Meth, Jamee SAUNDERS, DO.  Called Nurse Triage reporting Urinary Tract Infection.  Symptoms began yesterday.  Interventions attempted: Rest, hydration, or home remedies.  Symptoms are: gradually worsening.  Triage Disposition: See HCP Within 4 Hours (Or PCP Triage)  Patient/caregiver understands and will follow disposition?: Yes    Copied from CRM #[id number]. Topic: Clinical - Red Word Triage >> Jul 31, 2024  9:43 AM Deleta RAMAN wrote: Red Word that prompted transfer to Nurse Triage: uti pain level is a 7 getting worse Reason for Disposition  [1] SEVERE pain with urination (e.g., excruciating) AND [2] not improved after 2 hours of pain medicine    Pt has history of frequent UTIs and reports the symptoms began rapidly last night.  Answer Assessment - Initial Assessment Questions 1. SEVERITY: How bad is the pain?  (e.g., Scale 1-10; mild, moderate, or severe) Moderate suprapubic cramping  2. FREQUENCY: How many times have you had painful urination today?      Every 5 minutes   3. PATTERN: Is pain present every time you urinate or just sometimes?      Every time  4. ONSET: When did the painful urination start?      Last night at 8pm  5. FEVER: Do you have a fever? If Yes, ask: What is your temperature, how was it measured, and when did it start?     Denies  6. PAST UTI: Have you had a urine infection before? If Yes, ask: When was the last time? and What happened that time?      History of frequent UTIs, required surgery in the past   7. CAUSE: What do you think is causing the painful urination?  (e.g., UTI, scratch, Herpes sore)     UTI   8. OTHER SYMPTOMS: Do you have any other symptoms? (e.g., blood in urine, flank pain, genital sores, urgency, vaginal discharge) Frequency, pain, urgency, upset stomach  suprapubic cramps, was nauseous last night Last night at 8p  Protocols used: Urination Pain - [facility]

## 2024-07-31 NOTE — Telephone Encounter (Signed)
 Appointment scheduled.

## 2024-07-31 NOTE — Progress Notes (Addendum)
 "  Careteam: Patient Care Team: Antonio Meth, Jamee SAUNDERS, DO as PCP - General (Family Medicine) Gynecology, [facility] (Obstetrics and Gynecology)  Allergies[1]  Chief Complaint  Patient presents with   Urinary Frequency    Pt is having urinary frequency and pain.    Discussed the use of AI scribe software for clinical note transcription with the patient, who gave verbal consent to proceed.  History of Present Illness  48 yr old F presented to clinic for urinary symptoms Symptoms began last night with frequent urination and dysuria occurring every five minutes, described as 'horrible'. She experiences lower abdominal pain and uses a rice bag for relief. No hematuria noted, but she took Azo last night, which can alter urine color.  She has a history of right ureteral reflux surgery performed 23 years ago due to recurrent kidney and urinary tract infections since the age of [age]. She mentions having fewer infections since the surgery, but this is her third urinary tract infection this year.  She felt feverish last night but reports normal temperature today. She also experienced nausea and a headache last night, attributing it to lack of sleep. No vomiting or diarrhea, but felt an urgency for bowel movements without diarrhea.  She is allergic to penicillin. She drinks a lot of water and has managed previous infections without antibiotics if caught early.     Review of Systems:  Review of Systems  Constitutional:  Positive for chills. Negative for fever.  HENT:  Negative for congestion and sore throat.   Respiratory:  Negative for cough, sputum production and shortness of breath.   Cardiovascular:  Negative for chest pain, palpitations and leg swelling.  Gastrointestinal:  Positive for abdominal pain and nausea. Negative for heartburn and vomiting.  Genitourinary:  Positive for dysuria and frequency. Negative for hematuria.  Musculoskeletal:  Negative for falls and myalgias.   Neurological:  Negative for dizziness, sensory change and focal weakness.   Negative unless indicated in HPI.   Patient Active Problem List   Diagnosis Date Noted   Wrist strain, right, subsequent encounter 07/16/2024   Cervical cancer screening 07/16/2024   Dysuria 01/20/2021   Attention deficit disorder (ADD) without hyperactivity 05/28/2020   Anxiety 04/30/2019   Hyperlipidemia 04/30/2019   Essential hypertension 12/18/2017   Syncope 12/18/2017   S/P cesarean section 01/02/2013   Normal pregnancy in multigravida 12/31/2012   Past Medical History:  Diagnosis Date   Abnormal Pap smear    Allergy    AMA (advanced maternal age) multigravida 35+    Bronchitis    Chicken pox    Chronic kidney disease    R ureteral reflux   GERD (gastroesophageal reflux disease)    H/O pre-eclampsia in prior pregnancy, currently pregnant    Heartburn    Hx of pyelonephritis    Hypertension    Normal pregnancy, repeat 12/31/2012   S/P cesarean section 01/02/2013   Ureteral reflux    right   Past Surgical History:  Procedure Laterality Date   BILATERAL SALPINGECTOMY Bilateral 01/02/2013   Procedure: BILATERAL SALPINGECTOMY;  Surgeon: Ezzie Marshall, MD;  Location: [facility];  Service: Obstetrics;  Laterality: Bilateral;   CESAREAN SECTION N/A 01/02/2013   Procedure: CESAREAN SECTION;  Surgeon: Ezzie Marshall, MD;  Location: [facility];  Service: Obstetrics;  Laterality: N/A;  Primary Cesarean Section Delivery Baby Boy @ 0410, Apgars    KIDNEY SURGERY     Correct R ureteral reflux, 2002   TONSILLECTOMY     Social History[2]  Family History  Problem Relation Age of Onset   Cancer Mother    Miscarriages / Stillbirths Mother    Breast cancer Mother    Cancer Father    Diabetes Father    Diabetes Sister    Miscarriages / Stillbirths Sister    Depression Daughter    Heart disease Maternal Grandmother    Stroke Paternal Grandmother    Cancer Sister    Hypertension Sister    Early death Sister    Asthma  Sister    Mental retardation Sister    Allergies[3]  Medications: Patient's Medications  New Prescriptions   NITROFURANTOIN , MACROCRYSTAL-MONOHYDRATE, (MACROBID ) 100 MG CAPSULE    Take 1 capsule (100 mg total) by mouth 2 (two) times daily.  Previous Medications   ALBUTEROL (VENTOLIN HFA) 108 (90 BASE) MCG/ACT INHALER    Inhale 1 puff into the lungs.   CETIRIZINE (ZYRTEC) 10 MG TABLET    Take 10 mg by mouth daily.   LISDEXAMFETAMINE  (VYVANSE ) 40 MG CAPSULE    Take 1 capsule (40 mg total) by mouth every morning.   LISDEXAMFETAMINE  (VYVANSE ) 40 MG CAPSULE    Take 1 capsule (40 mg total) by mouth every morning.   LISDEXAMFETAMINE  (VYVANSE ) 40 MG CAPSULE    Take 1 capsule (40 mg total) by mouth every morning.   PREDNISONE  (DELTASONE ) 10 MG TABLET    TAKE 3 TABLETS PO QD FOR 3 DAYS THEN TAKE 2 TABLETS PO QD FOR 3 DAYS THEN TAKE 1 TABLET PO QD FOR 3 DAYS THEN TAKE 1/2 TAB PO QD FOR 3 DAYS  Modified Medications   No medications on file  Discontinued Medications   No medications on file    Physical Exam: Vitals:   07/31/24 1053  BP: 136/89  Pulse: 74  Temp: 97.6 F (36.4 C)  TempSrc: Oral  SpO2: 100%  Weight: 137 lb 12.8 oz (62.5 kg)   Body mass index is 24.8 kg/m. BP Readings from Last 3 Encounters:  07/31/24 136/89  07/16/24 120/80  02/13/24 126/82   Wt Readings from Last 3 Encounters:  07/31/24 137 lb 12.8 oz (62.5 kg)  07/16/24 137 lb 12.8 oz (62.5 kg)  11/23/23 141 lb 12.8 oz (64.3 kg)    Physical Exam Constitutional:      Appearance: Normal appearance.  HENT:     Head: Normocephalic and atraumatic.  Cardiovascular:     Rate and Rhythm: Normal rate and regular rhythm.  Pulmonary:     Effort: Pulmonary effort is normal. No respiratory distress.     Breath sounds: Normal breath sounds. No wheezing.  Abdominal:     General: Bowel sounds are normal. There is no distension.     Tenderness: There is abdominal tenderness (lower abdominal tenderness). There is no  guarding or rebound.     Comments:    Musculoskeletal:        General: No swelling or tenderness.  Neurological:     Mental Status: She is alert. Mental status is at baseline.     Sensory: No sensory deficit.     Motor: No weakness.     Labs reviewed: Basic Metabolic Panel: No results for input(s): NA, K, CL, CO2, GLUCOSE, BUN, CREATININE, CALCIUM , MG, PHOS, TSH in the last 8760 hours. Liver Function Tests: No results for input(s): AST, ALT, ALKPHOS, BILITOT, PROT, ALBUMIN in the last 8760 hours. No results for input(s): LIPASE, AMYLASE in the last 8760 hours. No results for input(s): AMMONIA in the last 8760 hours. CBC: No results for  input(s): WBC, NEUTROABS, HGB, HCT, MCV, PLT in the last 8760 hours. Lipid Panel: No results for input(s): CHOL, HDL, LDLCALC, TRIG, CHOLHDL, LDLDIRECT in the last 8760 hours. TSH: No results for input(s): TSH in the last 8760 hours. A1C: Lab Results  Component Value Date   HGBA1C 5.9 05/09/2019    Assessment & Plan Urinary frequency Urine dipstick positive Orders:   POCT Urinalysis Dipstick (Automated)   Urine Culture  Acute cystitis without hematuria Urine dipstick positive Will send the urine for culture Will send Macrobid  to her pharmacy Increase oral hydration Orders:   nitrofurantoin , macrocrystal-monohydrate, (MACROBID ) 100 MG capsule; Take 1 capsule (100 mg total) by mouth 2 (two) times daily.   No follow-ups on file.: follow up with PCP  Wood Novacek     [1]  Allergies Allergen Reactions   Amoxicillin Hives, Shortness Of Breath and Swelling    Can't take any cilllins   Penicillins Hives, Shortness Of Breath and Swelling   Corn-Containing Products    Lisinopril Other (See Comments)    Throat itching   Amlodipine Palpitations  [2]  Social History Tobacco Use   Smoking status: Some Days    Current packs/day: 0.25    Average packs/day: 0.3  packs/day for 34.0 years (8.5 ttl pk-yrs)    Types: Cigarettes   Smokeless tobacco: Never   Tobacco comments:    down to 4-5 a day(12/18/17)  Substance Use Topics   Alcohol use: Yes    Comment: Occas. since + UPT,  LAST TIME HAD MARIJUANA- SEPT.SABRA  LAST TIME HAD ALCOHOL- [place] EVE   Drug use: Not Currently    Comment: None since + UPT  [3]  Allergies Allergen Reactions   Amoxicillin Hives, Shortness Of Breath and Swelling    Can't take any cilllins   Penicillins Hives, Shortness Of Breath and Swelling   Corn-Containing Products    Lisinopril Other (See Comments)    Throat itching   Amlodipine Palpitations   "

## 2024-08-03 LAB — URINE CULTURE
MICRO NUMBER:: 17417246
SPECIMEN QUALITY:: ADEQUATE

## 2024-08-05 ENCOUNTER — Ambulatory Visit: Payer: Self-pay | Admitting: Sports Medicine

## 2024-08-27 ENCOUNTER — Ambulatory Visit: Admitting: Orthopedic Surgery

## 2024-09-04 ENCOUNTER — Encounter: Payer: Self-pay | Admitting: Gastroenterology

## 2024-09-12 ENCOUNTER — Ambulatory Visit: Admitting: Orthopedic Surgery

## 2025-01-06 ENCOUNTER — Encounter: Admitting: Family Medicine
